# Patient Record
Sex: Female | Born: 1955
Health system: Southern US, Community
[De-identification: ages and names within clinical notes are randomized; demographics above are authoritative.]

## PROBLEM LIST (undated history)

## (undated) DIAGNOSIS — R011 Cardiac murmur, unspecified: Secondary | ICD-10-CM

## (undated) DIAGNOSIS — F10231 Alcohol dependence with withdrawal delirium: Secondary | ICD-10-CM

## (undated) DIAGNOSIS — H539 Unspecified visual disturbance: Secondary | ICD-10-CM

## (undated) DIAGNOSIS — I10 Essential (primary) hypertension: Secondary | ICD-10-CM

## (undated) DIAGNOSIS — D126 Benign neoplasm of colon, unspecified: Secondary | ICD-10-CM

## (undated) DIAGNOSIS — K7010 Alcoholic hepatitis without ascites: Secondary | ICD-10-CM

## (undated) DIAGNOSIS — E871 Hypo-osmolality and hyponatremia: Secondary | ICD-10-CM

## (undated) DIAGNOSIS — F32A Depression, unspecified: Secondary | ICD-10-CM

## (undated) DIAGNOSIS — M8580 Other specified disorders of bone density and structure, unspecified site: Secondary | ICD-10-CM

## (undated) DIAGNOSIS — E785 Hyperlipidemia, unspecified: Secondary | ICD-10-CM

## (undated) DIAGNOSIS — K219 Gastro-esophageal reflux disease without esophagitis: Secondary | ICD-10-CM

## (undated) DIAGNOSIS — F329 Major depressive disorder, single episode, unspecified: Secondary | ICD-10-CM

## (undated) HISTORY — DX: Unspecified visual disturbance: H53.9

## (undated) HISTORY — PX: BACK SURGERY: SHX140

## (undated) HISTORY — DX: Hyperlipidemia, unspecified: E78.5

## (undated) HISTORY — DX: Essential (primary) hypertension: I10

## (undated) HISTORY — DX: Alcohol dependence with withdrawal delirium: F10.231

## (undated) HISTORY — DX: Gastro-esophageal reflux disease without esophagitis: K21.9

## (undated) HISTORY — DX: Depression, unspecified: F32.A

## (undated) HISTORY — PX: COLONOSCOPY: SHX174

## (undated) HISTORY — DX: Major depressive disorder, single episode, unspecified: F32.9

## (undated) HISTORY — DX: Cardiac murmur, unspecified: R01.1

## (undated) HISTORY — DX: Other specified disorders of bone density and structure, unspecified site: M85.80

## (undated) HISTORY — DX: Benign neoplasm of colon, unspecified: D12.6

## (undated) HISTORY — DX: Alcoholic hepatitis without ascites: K70.10

---

## 2001-05-30 HISTORY — PX: BUNIONECTOMY: SHX129

## 2003-05-31 HISTORY — PX: MANDIBLE SURGERY: SHX707

## 2010-03-19 DIAGNOSIS — M51379 Other intervertebral disc degeneration, lumbosacral region without mention of lumbar back pain or lower extremity pain: Secondary | ICD-10-CM | POA: Insufficient documentation

## 2010-03-19 DIAGNOSIS — M5137 Other intervertebral disc degeneration, lumbosacral region: Secondary | ICD-10-CM | POA: Insufficient documentation

## 2010-06-01 DIAGNOSIS — F1010 Alcohol abuse, uncomplicated: Secondary | ICD-10-CM | POA: Insufficient documentation

## 2010-06-01 DIAGNOSIS — M961 Postlaminectomy syndrome, not elsewhere classified: Secondary | ICD-10-CM | POA: Insufficient documentation

## 2010-06-01 DIAGNOSIS — F1120 Opioid dependence, uncomplicated: Secondary | ICD-10-CM | POA: Insufficient documentation

## 2010-06-01 DIAGNOSIS — G894 Chronic pain syndrome: Secondary | ICD-10-CM | POA: Insufficient documentation

## 2013-05-10 DIAGNOSIS — K219 Gastro-esophageal reflux disease without esophagitis: Secondary | ICD-10-CM | POA: Insufficient documentation

## 2013-05-30 HISTORY — PX: TOTAL SHOULDER REPLACEMENT: SUR1217

## 2015-06-29 DIAGNOSIS — I10 Essential (primary) hypertension: Secondary | ICD-10-CM | POA: Insufficient documentation

## 2015-06-29 DIAGNOSIS — I951 Orthostatic hypotension: Secondary | ICD-10-CM | POA: Insufficient documentation

## 2015-06-29 DIAGNOSIS — Z8249 Family history of ischemic heart disease and other diseases of the circulatory system: Secondary | ICD-10-CM | POA: Insufficient documentation

## 2015-06-29 DIAGNOSIS — I340 Nonrheumatic mitral (valve) insufficiency: Secondary | ICD-10-CM | POA: Insufficient documentation

## 2016-06-01 DIAGNOSIS — M5440 Lumbago with sciatica, unspecified side: Secondary | ICD-10-CM | POA: Diagnosis not present

## 2016-06-17 DIAGNOSIS — Z96612 Presence of left artificial shoulder joint: Secondary | ICD-10-CM | POA: Diagnosis not present

## 2016-06-17 DIAGNOSIS — M7542 Impingement syndrome of left shoulder: Secondary | ICD-10-CM | POA: Diagnosis not present

## 2016-06-17 DIAGNOSIS — M25519 Pain in unspecified shoulder: Secondary | ICD-10-CM | POA: Diagnosis not present

## 2016-06-29 DIAGNOSIS — M5440 Lumbago with sciatica, unspecified side: Secondary | ICD-10-CM | POA: Diagnosis not present

## 2016-07-27 DIAGNOSIS — M5440 Lumbago with sciatica, unspecified side: Secondary | ICD-10-CM | POA: Diagnosis not present

## 2016-08-09 DIAGNOSIS — M4316 Spondylolisthesis, lumbar region: Secondary | ICD-10-CM | POA: Diagnosis not present

## 2016-08-09 DIAGNOSIS — Z981 Arthrodesis status: Secondary | ICD-10-CM | POA: Diagnosis not present

## 2016-08-09 DIAGNOSIS — M5136 Other intervertebral disc degeneration, lumbar region: Secondary | ICD-10-CM | POA: Diagnosis not present

## 2016-08-17 DIAGNOSIS — F410 Panic disorder [episodic paroxysmal anxiety] without agoraphobia: Secondary | ICD-10-CM | POA: Diagnosis not present

## 2016-08-17 DIAGNOSIS — I10 Essential (primary) hypertension: Secondary | ICD-10-CM | POA: Diagnosis not present

## 2016-08-17 DIAGNOSIS — F329 Major depressive disorder, single episode, unspecified: Secondary | ICD-10-CM | POA: Diagnosis not present

## 2016-08-24 DIAGNOSIS — M542 Cervicalgia: Secondary | ICD-10-CM | POA: Diagnosis not present

## 2016-08-24 DIAGNOSIS — M5440 Lumbago with sciatica, unspecified side: Secondary | ICD-10-CM | POA: Diagnosis not present

## 2016-09-07 DIAGNOSIS — M5136 Other intervertebral disc degeneration, lumbar region: Secondary | ICD-10-CM | POA: Diagnosis not present

## 2016-09-07 DIAGNOSIS — M4316 Spondylolisthesis, lumbar region: Secondary | ICD-10-CM | POA: Diagnosis not present

## 2016-09-12 DIAGNOSIS — M4316 Spondylolisthesis, lumbar region: Secondary | ICD-10-CM | POA: Diagnosis not present

## 2016-09-12 DIAGNOSIS — M5136 Other intervertebral disc degeneration, lumbar region: Secondary | ICD-10-CM | POA: Diagnosis not present

## 2016-09-15 DIAGNOSIS — M4316 Spondylolisthesis, lumbar region: Secondary | ICD-10-CM | POA: Diagnosis not present

## 2016-09-15 DIAGNOSIS — M5136 Other intervertebral disc degeneration, lumbar region: Secondary | ICD-10-CM | POA: Diagnosis not present

## 2016-09-21 DIAGNOSIS — M5440 Lumbago with sciatica, unspecified side: Secondary | ICD-10-CM | POA: Diagnosis not present

## 2016-09-21 DIAGNOSIS — M545 Low back pain: Secondary | ICD-10-CM | POA: Diagnosis not present

## 2016-09-23 DIAGNOSIS — M4316 Spondylolisthesis, lumbar region: Secondary | ICD-10-CM | POA: Diagnosis not present

## 2016-09-23 DIAGNOSIS — M5136 Other intervertebral disc degeneration, lumbar region: Secondary | ICD-10-CM | POA: Diagnosis not present

## 2016-09-28 DIAGNOSIS — M5136 Other intervertebral disc degeneration, lumbar region: Secondary | ICD-10-CM | POA: Diagnosis not present

## 2016-09-28 DIAGNOSIS — M4316 Spondylolisthesis, lumbar region: Secondary | ICD-10-CM | POA: Diagnosis not present

## 2016-09-30 DIAGNOSIS — M4316 Spondylolisthesis, lumbar region: Secondary | ICD-10-CM | POA: Diagnosis not present

## 2016-09-30 DIAGNOSIS — M5136 Other intervertebral disc degeneration, lumbar region: Secondary | ICD-10-CM | POA: Diagnosis not present

## 2016-10-03 DIAGNOSIS — I10 Essential (primary) hypertension: Secondary | ICD-10-CM | POA: Diagnosis not present

## 2016-10-03 DIAGNOSIS — Z6826 Body mass index (BMI) 26.0-26.9, adult: Secondary | ICD-10-CM | POA: Diagnosis not present

## 2016-10-03 DIAGNOSIS — E785 Hyperlipidemia, unspecified: Secondary | ICD-10-CM | POA: Diagnosis not present

## 2016-10-03 DIAGNOSIS — M5416 Radiculopathy, lumbar region: Secondary | ICD-10-CM | POA: Diagnosis not present

## 2016-10-03 DIAGNOSIS — M47816 Spondylosis without myelopathy or radiculopathy, lumbar region: Secondary | ICD-10-CM | POA: Diagnosis not present

## 2016-10-04 DIAGNOSIS — M4316 Spondylolisthesis, lumbar region: Secondary | ICD-10-CM | POA: Diagnosis not present

## 2016-10-04 DIAGNOSIS — M5136 Other intervertebral disc degeneration, lumbar region: Secondary | ICD-10-CM | POA: Diagnosis not present

## 2016-10-06 DIAGNOSIS — M5136 Other intervertebral disc degeneration, lumbar region: Secondary | ICD-10-CM | POA: Diagnosis not present

## 2016-10-06 DIAGNOSIS — M4316 Spondylolisthesis, lumbar region: Secondary | ICD-10-CM | POA: Diagnosis not present

## 2016-10-17 DIAGNOSIS — M4316 Spondylolisthesis, lumbar region: Secondary | ICD-10-CM | POA: Diagnosis not present

## 2016-10-17 DIAGNOSIS — M5136 Other intervertebral disc degeneration, lumbar region: Secondary | ICD-10-CM | POA: Diagnosis not present

## 2016-10-18 DIAGNOSIS — M5136 Other intervertebral disc degeneration, lumbar region: Secondary | ICD-10-CM | POA: Diagnosis not present

## 2016-10-18 DIAGNOSIS — M4316 Spondylolisthesis, lumbar region: Secondary | ICD-10-CM | POA: Diagnosis not present

## 2016-10-19 DIAGNOSIS — M961 Postlaminectomy syndrome, not elsewhere classified: Secondary | ICD-10-CM | POA: Diagnosis not present

## 2016-10-19 DIAGNOSIS — M5440 Lumbago with sciatica, unspecified side: Secondary | ICD-10-CM | POA: Diagnosis not present

## 2016-10-19 DIAGNOSIS — G894 Chronic pain syndrome: Secondary | ICD-10-CM | POA: Diagnosis not present

## 2016-11-16 DIAGNOSIS — M5440 Lumbago with sciatica, unspecified side: Secondary | ICD-10-CM | POA: Diagnosis not present

## 2016-12-14 DIAGNOSIS — M5440 Lumbago with sciatica, unspecified side: Secondary | ICD-10-CM | POA: Diagnosis not present

## 2016-12-14 DIAGNOSIS — M961 Postlaminectomy syndrome, not elsewhere classified: Secondary | ICD-10-CM | POA: Diagnosis not present

## 2016-12-14 DIAGNOSIS — G894 Chronic pain syndrome: Secondary | ICD-10-CM | POA: Diagnosis not present

## 2016-12-26 DIAGNOSIS — R740 Nonspecific elevation of levels of transaminase and lactic acid dehydrogenase [LDH]: Secondary | ICD-10-CM | POA: Diagnosis not present

## 2016-12-26 DIAGNOSIS — F410 Panic disorder [episodic paroxysmal anxiety] without agoraphobia: Secondary | ICD-10-CM | POA: Diagnosis not present

## 2016-12-26 DIAGNOSIS — T402X5A Adverse effect of other opioids, initial encounter: Secondary | ICD-10-CM | POA: Diagnosis not present

## 2016-12-26 DIAGNOSIS — K5903 Drug induced constipation: Secondary | ICD-10-CM | POA: Diagnosis not present

## 2016-12-26 DIAGNOSIS — I10 Essential (primary) hypertension: Secondary | ICD-10-CM | POA: Diagnosis not present

## 2016-12-26 DIAGNOSIS — K219 Gastro-esophageal reflux disease without esophagitis: Secondary | ICD-10-CM | POA: Diagnosis not present

## 2016-12-26 DIAGNOSIS — E538 Deficiency of other specified B group vitamins: Secondary | ICD-10-CM | POA: Diagnosis not present

## 2016-12-29 DIAGNOSIS — S60456A Superficial foreign body of right little finger, initial encounter: Secondary | ICD-10-CM | POA: Diagnosis not present

## 2017-01-02 DIAGNOSIS — R740 Nonspecific elevation of levels of transaminase and lactic acid dehydrogenase [LDH]: Secondary | ICD-10-CM | POA: Diagnosis not present

## 2017-01-02 DIAGNOSIS — K838 Other specified diseases of biliary tract: Secondary | ICD-10-CM | POA: Diagnosis not present

## 2017-02-08 DIAGNOSIS — M5440 Lumbago with sciatica, unspecified side: Secondary | ICD-10-CM | POA: Diagnosis not present

## 2017-03-08 DIAGNOSIS — M961 Postlaminectomy syndrome, not elsewhere classified: Secondary | ICD-10-CM | POA: Diagnosis not present

## 2017-03-08 DIAGNOSIS — M5440 Lumbago with sciatica, unspecified side: Secondary | ICD-10-CM | POA: Diagnosis not present

## 2017-03-08 DIAGNOSIS — G5623 Lesion of ulnar nerve, bilateral upper limbs: Secondary | ICD-10-CM | POA: Diagnosis not present

## 2017-03-24 DIAGNOSIS — Z23 Encounter for immunization: Secondary | ICD-10-CM | POA: Diagnosis not present

## 2017-04-05 DIAGNOSIS — M961 Postlaminectomy syndrome, not elsewhere classified: Secondary | ICD-10-CM | POA: Diagnosis not present

## 2017-04-05 DIAGNOSIS — G5623 Lesion of ulnar nerve, bilateral upper limbs: Secondary | ICD-10-CM | POA: Diagnosis not present

## 2017-04-05 DIAGNOSIS — G894 Chronic pain syndrome: Secondary | ICD-10-CM | POA: Diagnosis not present

## 2017-05-03 DIAGNOSIS — M5440 Lumbago with sciatica, unspecified side: Secondary | ICD-10-CM | POA: Diagnosis not present

## 2017-05-19 DIAGNOSIS — M5440 Lumbago with sciatica, unspecified side: Secondary | ICD-10-CM | POA: Diagnosis not present

## 2017-05-19 DIAGNOSIS — M5126 Other intervertebral disc displacement, lumbar region: Secondary | ICD-10-CM | POA: Diagnosis not present

## 2017-05-31 DIAGNOSIS — M5440 Lumbago with sciatica, unspecified side: Secondary | ICD-10-CM | POA: Diagnosis not present

## 2017-05-31 DIAGNOSIS — M542 Cervicalgia: Secondary | ICD-10-CM | POA: Diagnosis not present

## 2017-06-28 DIAGNOSIS — M5440 Lumbago with sciatica, unspecified side: Secondary | ICD-10-CM | POA: Diagnosis not present

## 2017-07-04 DIAGNOSIS — M48062 Spinal stenosis, lumbar region with neurogenic claudication: Secondary | ICD-10-CM | POA: Diagnosis not present

## 2017-07-04 DIAGNOSIS — Z01818 Encounter for other preprocedural examination: Secondary | ICD-10-CM | POA: Diagnosis not present

## 2017-07-04 DIAGNOSIS — R9431 Abnormal electrocardiogram [ECG] [EKG]: Secondary | ICD-10-CM | POA: Diagnosis not present

## 2017-07-04 DIAGNOSIS — Z01810 Encounter for preprocedural cardiovascular examination: Secondary | ICD-10-CM | POA: Diagnosis not present

## 2017-07-04 DIAGNOSIS — M532X6 Spinal instabilities, lumbar region: Secondary | ICD-10-CM | POA: Diagnosis not present

## 2017-07-04 DIAGNOSIS — I951 Orthostatic hypotension: Secondary | ICD-10-CM | POA: Diagnosis not present

## 2017-07-04 DIAGNOSIS — M7989 Other specified soft tissue disorders: Secondary | ICD-10-CM | POA: Diagnosis not present

## 2017-07-04 DIAGNOSIS — Z981 Arthrodesis status: Secondary | ICD-10-CM | POA: Diagnosis not present

## 2017-07-04 DIAGNOSIS — I10 Essential (primary) hypertension: Secondary | ICD-10-CM | POA: Diagnosis not present

## 2017-07-14 DIAGNOSIS — Z01810 Encounter for preprocedural cardiovascular examination: Secondary | ICD-10-CM | POA: Diagnosis not present

## 2017-07-21 DIAGNOSIS — G8929 Other chronic pain: Secondary | ICD-10-CM | POA: Diagnosis not present

## 2017-07-21 DIAGNOSIS — M5442 Lumbago with sciatica, left side: Secondary | ICD-10-CM | POA: Diagnosis not present

## 2017-07-21 DIAGNOSIS — M5441 Lumbago with sciatica, right side: Secondary | ICD-10-CM | POA: Diagnosis not present

## 2017-08-23 DIAGNOSIS — G8929 Other chronic pain: Secondary | ICD-10-CM | POA: Diagnosis not present

## 2017-08-23 DIAGNOSIS — M5441 Lumbago with sciatica, right side: Secondary | ICD-10-CM | POA: Diagnosis not present

## 2017-08-23 DIAGNOSIS — M5442 Lumbago with sciatica, left side: Secondary | ICD-10-CM | POA: Diagnosis not present

## 2017-09-13 DIAGNOSIS — Z87891 Personal history of nicotine dependence: Secondary | ICD-10-CM | POA: Diagnosis not present

## 2017-09-13 DIAGNOSIS — E876 Hypokalemia: Secondary | ICD-10-CM | POA: Diagnosis not present

## 2017-09-13 DIAGNOSIS — E871 Hypo-osmolality and hyponatremia: Secondary | ICD-10-CM | POA: Diagnosis not present

## 2017-09-14 DIAGNOSIS — K838 Other specified diseases of biliary tract: Secondary | ICD-10-CM | POA: Diagnosis not present

## 2017-09-14 DIAGNOSIS — E878 Other disorders of electrolyte and fluid balance, not elsewhere classified: Secondary | ICD-10-CM | POA: Diagnosis not present

## 2017-09-15 DIAGNOSIS — T8182XA Emphysema (subcutaneous) resulting from a procedure, initial encounter: Secondary | ICD-10-CM | POA: Diagnosis not present

## 2017-09-15 DIAGNOSIS — I10 Essential (primary) hypertension: Secondary | ICD-10-CM | POA: Diagnosis not present

## 2017-09-15 DIAGNOSIS — M48062 Spinal stenosis, lumbar region with neurogenic claudication: Secondary | ICD-10-CM | POA: Diagnosis not present

## 2017-09-15 DIAGNOSIS — E785 Hyperlipidemia, unspecified: Secondary | ICD-10-CM | POA: Diagnosis not present

## 2017-09-15 DIAGNOSIS — E871 Hypo-osmolality and hyponatremia: Secondary | ICD-10-CM | POA: Diagnosis not present

## 2017-09-15 DIAGNOSIS — M48061 Spinal stenosis, lumbar region without neurogenic claudication: Secondary | ICD-10-CM | POA: Diagnosis not present

## 2017-09-15 DIAGNOSIS — E876 Hypokalemia: Secondary | ICD-10-CM | POA: Diagnosis not present

## 2017-09-15 DIAGNOSIS — E878 Other disorders of electrolyte and fluid balance, not elsewhere classified: Secondary | ICD-10-CM | POA: Diagnosis not present

## 2017-09-15 DIAGNOSIS — M5136 Other intervertebral disc degeneration, lumbar region: Secondary | ICD-10-CM | POA: Diagnosis not present

## 2017-09-15 HISTORY — PX: POSTERIOR LUMBAR VERTEBRAE EXCISION: SUR545

## 2017-09-16 DIAGNOSIS — E876 Hypokalemia: Secondary | ICD-10-CM | POA: Diagnosis not present

## 2017-09-16 DIAGNOSIS — I10 Essential (primary) hypertension: Secondary | ICD-10-CM | POA: Diagnosis not present

## 2017-09-16 DIAGNOSIS — Z9889 Other specified postprocedural states: Secondary | ICD-10-CM | POA: Diagnosis not present

## 2017-09-16 DIAGNOSIS — E785 Hyperlipidemia, unspecified: Secondary | ICD-10-CM | POA: Diagnosis not present

## 2017-09-16 DIAGNOSIS — E871 Hypo-osmolality and hyponatremia: Secondary | ICD-10-CM | POA: Diagnosis not present

## 2017-09-18 DIAGNOSIS — R918 Other nonspecific abnormal finding of lung field: Secondary | ICD-10-CM | POA: Diagnosis not present

## 2017-09-18 DIAGNOSIS — Z4682 Encounter for fitting and adjustment of non-vascular catheter: Secondary | ICD-10-CM | POA: Diagnosis not present

## 2017-09-18 DIAGNOSIS — E785 Hyperlipidemia, unspecified: Secondary | ICD-10-CM | POA: Diagnosis not present

## 2017-09-18 DIAGNOSIS — E878 Other disorders of electrolyte and fluid balance, not elsewhere classified: Secondary | ICD-10-CM | POA: Diagnosis not present

## 2017-09-18 DIAGNOSIS — I10 Essential (primary) hypertension: Secondary | ICD-10-CM | POA: Diagnosis not present

## 2017-09-18 DIAGNOSIS — E871 Hypo-osmolality and hyponatremia: Secondary | ICD-10-CM | POA: Diagnosis not present

## 2017-09-18 DIAGNOSIS — E876 Hypokalemia: Secondary | ICD-10-CM | POA: Diagnosis not present

## 2017-09-19 DIAGNOSIS — E876 Hypokalemia: Secondary | ICD-10-CM | POA: Diagnosis not present

## 2017-09-19 DIAGNOSIS — I10 Essential (primary) hypertension: Secondary | ICD-10-CM | POA: Diagnosis not present

## 2017-09-19 DIAGNOSIS — E785 Hyperlipidemia, unspecified: Secondary | ICD-10-CM | POA: Diagnosis not present

## 2017-09-19 DIAGNOSIS — E871 Hypo-osmolality and hyponatremia: Secondary | ICD-10-CM | POA: Diagnosis not present

## 2017-09-19 DIAGNOSIS — E878 Other disorders of electrolyte and fluid balance, not elsewhere classified: Secondary | ICD-10-CM | POA: Diagnosis not present

## 2017-09-25 DIAGNOSIS — E871 Hypo-osmolality and hyponatremia: Secondary | ICD-10-CM | POA: Diagnosis not present

## 2017-09-26 DIAGNOSIS — E871 Hypo-osmolality and hyponatremia: Secondary | ICD-10-CM | POA: Diagnosis not present

## 2017-09-28 DIAGNOSIS — Z981 Arthrodesis status: Secondary | ICD-10-CM | POA: Diagnosis not present

## 2017-09-28 DIAGNOSIS — Z9889 Other specified postprocedural states: Secondary | ICD-10-CM | POA: Diagnosis not present

## 2017-09-29 DIAGNOSIS — Z981 Arthrodesis status: Secondary | ICD-10-CM | POA: Diagnosis not present

## 2017-09-29 DIAGNOSIS — T8182XA Emphysema (subcutaneous) resulting from a procedure, initial encounter: Secondary | ICD-10-CM | POA: Diagnosis not present

## 2017-09-29 DIAGNOSIS — R222 Localized swelling, mass and lump, trunk: Secondary | ICD-10-CM | POA: Diagnosis not present

## 2017-09-29 DIAGNOSIS — E871 Hypo-osmolality and hyponatremia: Secondary | ICD-10-CM | POA: Diagnosis not present

## 2017-10-01 DIAGNOSIS — E871 Hypo-osmolality and hyponatremia: Secondary | ICD-10-CM | POA: Diagnosis not present

## 2017-10-04 DIAGNOSIS — Z981 Arthrodesis status: Secondary | ICD-10-CM | POA: Diagnosis not present

## 2017-10-12 ENCOUNTER — Telehealth: Payer: Self-pay | Admitting: General Practice

## 2017-10-12 NOTE — Telephone Encounter (Signed)
Pt came in asking to establish herself and husband with Dr. Birdie Riddle. Mentioned speaking with someone previously about it but could not recall when or who she spoke with. Also submitted a copy of script for Physical Therapy, written at [facility] on 10/05/17. PT states that she is in pain post back-surgery and wants to establish quickly. Copy of PT script placed in front bin. Requested a call back to confirm, leave a message if not answered.

## 2017-10-12 NOTE — Telephone Encounter (Signed)
Please advise 

## 2017-10-12 NOTE — Telephone Encounter (Signed)
Pt scheduled to establish with Dr. Jonni Sanger.

## 2017-10-12 NOTE — Telephone Encounter (Signed)
Not able to accept pt at this time as I do not have any upcoming availability and her condition clearly requires care sooner than I can provide.  She will need to establish with someone else.

## 2017-10-17 ENCOUNTER — Ambulatory Visit (INDEPENDENT_AMBULATORY_CARE_PROVIDER_SITE_OTHER): Payer: Medicare Other | Admitting: Family Medicine

## 2017-10-17 ENCOUNTER — Encounter: Payer: Self-pay | Admitting: Family Medicine

## 2017-10-17 ENCOUNTER — Other Ambulatory Visit: Payer: Self-pay

## 2017-10-17 ENCOUNTER — Other Ambulatory Visit: Payer: Self-pay | Admitting: Emergency Medicine

## 2017-10-17 VITALS — BP 144/82 | HR 80 | Temp 98.2°F | Ht 65.25 in | Wt 150.8 lb

## 2017-10-17 DIAGNOSIS — M48061 Spinal stenosis, lumbar region without neurogenic claudication: Secondary | ICD-10-CM | POA: Diagnosis not present

## 2017-10-17 DIAGNOSIS — F1120 Opioid dependence, uncomplicated: Secondary | ICD-10-CM | POA: Diagnosis not present

## 2017-10-17 DIAGNOSIS — I10 Essential (primary) hypertension: Secondary | ICD-10-CM

## 2017-10-17 DIAGNOSIS — M961 Postlaminectomy syndrome, not elsewhere classified: Secondary | ICD-10-CM | POA: Diagnosis not present

## 2017-10-17 DIAGNOSIS — F10931 Alcohol use, unspecified with withdrawal delirium: Secondary | ICD-10-CM

## 2017-10-17 DIAGNOSIS — K7010 Alcoholic hepatitis without ascites: Secondary | ICD-10-CM | POA: Diagnosis not present

## 2017-10-17 DIAGNOSIS — F10231 Alcohol dependence with withdrawal delirium: Secondary | ICD-10-CM

## 2017-10-17 DIAGNOSIS — F339 Major depressive disorder, recurrent, unspecified: Secondary | ICD-10-CM | POA: Insufficient documentation

## 2017-10-17 DIAGNOSIS — G8929 Other chronic pain: Secondary | ICD-10-CM

## 2017-10-17 DIAGNOSIS — G4701 Insomnia due to medical condition: Secondary | ICD-10-CM | POA: Diagnosis not present

## 2017-10-17 DIAGNOSIS — Z9889 Other specified postprocedural states: Secondary | ICD-10-CM

## 2017-10-17 DIAGNOSIS — G894 Chronic pain syndrome: Secondary | ICD-10-CM | POA: Diagnosis not present

## 2017-10-17 DIAGNOSIS — F1021 Alcohol dependence, in remission: Secondary | ICD-10-CM

## 2017-10-17 HISTORY — DX: Alcohol use, unspecified with withdrawal delirium: F10.931

## 2017-10-17 HISTORY — DX: Alcoholic hepatitis without ascites: K70.10

## 2017-10-17 HISTORY — DX: Alcohol dependence with withdrawal delirium: F10.231

## 2017-10-17 LAB — COMPREHENSIVE METABOLIC PANEL
ALT: 12 U/L (ref 0–35)
AST: 15 U/L (ref 0–37)
Albumin: 4.1 g/dL (ref 3.5–5.2)
Alkaline Phosphatase: 99 U/L (ref 39–117)
BUN: 9 mg/dL (ref 6–23)
CO2: 28 mEq/L (ref 19–32)
Calcium: 9 mg/dL (ref 8.4–10.5)
Chloride: 98 mEq/L (ref 96–112)
Creatinine, Ser: 0.62 mg/dL (ref 0.40–1.20)
GFR: 104.32 mL/min (ref >60.00)
Glucose, Bld: 105 mg/dL — ABNORMAL HIGH (ref 70–99)
Potassium: 3.7 mEq/L (ref 3.5–5.1)
Sodium: 134 mEq/L — ABNORMAL LOW (ref 135–145)
Total Bilirubin: 0.4 mg/dL (ref 0.2–1.2)
Total Protein: 6.3 g/dL (ref 6.0–8.3)

## 2017-10-17 LAB — CBC WITH DIFFERENTIAL/PLATELET
Basophils Absolute: 0 10*3/uL (ref 0.0–0.1)
Basophils Relative: 0.6 % (ref 0.0–3.0)
Eosinophils Absolute: 0.2 10*3/uL (ref 0.0–0.7)
Eosinophils Relative: 3.8 % (ref 0.0–5.0)
HCT: 31.1 % — ABNORMAL LOW (ref 36.0–46.0)
Hemoglobin: 10.9 g/dL — ABNORMAL LOW (ref 12.0–15.0)
Lymphocytes Relative: 24.4 % (ref 12.0–46.0)
Lymphs Abs: 1.4 10*3/uL (ref 0.7–4.0)
MCHC: 34.9 g/dL (ref 30.0–36.0)
MCV: 94.7 fl (ref 78.0–100.0)
Monocytes Absolute: 0.3 10*3/uL (ref 0.1–1.0)
Monocytes Relative: 5.8 % (ref 3.0–12.0)
Neutro Abs: 3.8 10*3/uL (ref 1.4–7.7)
Neutrophils Relative %: 65.4 % (ref 43.0–77.0)
Platelets: 476 10*3/uL — ABNORMAL HIGH (ref 150.0–400.0)
RBC: 3.29 Mil/uL — ABNORMAL LOW (ref 3.87–5.11)
RDW: 12.9 % (ref 11.5–15.5)
WBC: 5.8 10*3/uL (ref 4.0–10.5)

## 2017-10-17 LAB — LIPID PANEL
Cholesterol: 119 mg/dL (ref 0–200)
HDL: 56.6 mg/dL (ref >39.00)
LDL Cholesterol: 49 mg/dL (ref 0–99)
NonHDL: 62.73
Total CHOL/HDL Ratio: 2
Triglycerides: 68 mg/dL (ref 0.0–149.0)
VLDL: 13.6 mg/dL (ref 0.0–40.0)

## 2017-10-17 MED ORDER — LOSARTAN POTASSIUM 100 MG PO TABS: 100.0000 mg | ORAL_TABLET | Freq: Every day | ORAL | 1 refills | Status: DC

## 2017-10-17 MED ORDER — TRAZODONE HCL 50 MG PO TABS: 50.0000 mg | ORAL_TABLET | Freq: Every day | ORAL | 1 refills | Status: DC

## 2017-10-17 NOTE — Patient Instructions (Addendum)
Follow up in 6 weeks to recheck Blood Pressure & Sleep  Please go to the Lab for blood work.    If you have MyChart, your results will be available to view, please respond through [place] with questions.  We will schedule follow-up according to results.   We will call you with information regarding your referral appointment for Physical Therapy

## 2017-10-17 NOTE — Progress Notes (Signed)
Subjective  CC:  Chief Complaint  Patient presents with  . Establish Care    Transfer from [place], [place], needs to get Physical Therapy referral since having back Surgery.   . Back Pain    on going back Pain   . Hypertension  . Insomnia    having trouble falling and staying asleep     HPI: Shannon Young is a 62 y.o. female who presents to [place] at [facility] today to establish care with me as a new patient.  Vitals:   10/17/17 1303  BP: (!) 144/82  Pulse: 80  Temp: 98.2 F (36.8 C)   I have reviewed extensive notes from careeverywhere dating back to 2014. I've personally reviewed recent office visit notes, hospital notes, associated labs and imaging reports and/or pertinent outside office records via chart review or CareEverywhere. Briefly, 62 yo recently d/cd from [facility] hospital s/p lumbar spine surgery and DTs from alcohol and benzos. She has suffered from chronic pain and has been managed by [facility] [place]; she formerly lived in [place], relocating here to [place] last fall.   She has the following concerns or needs:  Since surgery, 4/19, she has not had anymore alcohol. She endorses long history of alcoholism; reports she has not had any problems quitting cold [place]. However, she is now having trouble sleeping. She has been weaned of benzo's. Has never been on sleep meds. Reports chronic back pain keep her awake.   Chronic pain and opiod dependence: managed by [facility] and pain clinic in [place]. I have reviewed narcotic use database.   HTN: BP was low upon d/c from hospital and chlorthalidone and amlodipine were stopped and losartan was halved. Since, bp running 140s/80s-90s. Has had preop clearance by cards recently with echocardiogram. No CAD  Reports recent h/o hyperlipidemia on lipitor but ran out. + FH of premature CAD.  Denies mood problems.   Assessment  1. Essential hypertension   2. Postlaminectomy syndrome, not  elsewhere classified   3. Uncomplicated opioid dependence ([place])   4. Chronic pain syndrome   5. Alcohol dependence in remission ([place])   6. Insomnia secondary to chronic pain   7. Status post lumbar surgery   8. Spinal stenosis of lumbar region, unspecified whether neurogenic claudication present      Plan   HTN: increase losartan back to 100 and monitor. Recheck in 6-8 weeks.   Chronic back pain: per [facility] pain mgt center. Referred to PT due to rehab needs after surgery.   Alcoholism in remission: praised. Check etoh level. Follow alcohol hepatitis with labs.  Insomnia: trial of trazadone. Educated on expectations. Avoid benzo's. Discussed dysregulated sleep due to recent heavy alcohol intake.   HM: defer for now; once above improved and pt is recovered, will need CPE. Pt agrees.   Follow up:  Return in about 6 weeks (around 11/28/2017) for follow up Hypertension, sleep. Orders Placed This Encounter  Procedures  . Comprehensive metabolic panel  . CBC with Differential/Platelet  . Lipid panel  . Ethanol  . Ambulatory referral to Orthopedic Surgery   Meds ordered this encounter  Medications  . traZODone (DESYREL) 50 MG tablet    Sig: Take 1 tablet (50 mg total) by mouth at bedtime.    Dispense:  90 tablet    Refill:  1  . losartan (COZAAR) 100 MG tablet    Sig: Take 1 tablet (100 mg total) by mouth daily.  Dispense:  90 tablet    Refill:  1      We updated and reviewed the patient's past history in detail and it is documented below.  Patient Active Problem List   Diagnosis Date Noted  . Depression 10/17/2017  . Alcohol dependence in remission ([place]) 10/17/2017  . Insomnia secondary to chronic pain 10/17/2017  . Spinal stenosis of lumbar region 10/17/2017  . Essential hypertension 06/29/2015  . Family history of ischemic heart disease (IHD) 06/29/2015  . Nonrheumatic mitral valve regurgitation 06/29/2015  . Orthostatic hypotension 06/29/2015  . Neck pain 10/02/2013      See lumbar plan of care   . GERD (gastroesophageal reflux disease) 05/10/2013  . Back pain 05/10/2013    Lumbar pain  Last Assessment & Plan:  [facility] PM&R Assessment and Plan Anomalous Vertebra?  No   Allergies that may influence a procedure:  no   Medications that may influence a procedure:  No   PMSH that may influence a procedure:  no   Diagnostic Tests:  EMG/NCS: Done previously, not in our system  MRI: Done previously, not in our system  X-ray: Done previously, not in our system   Prior treatments:  Physical Therapy: Yes, [place], Multiple bouts, 3 months plus HEP Medications tried: Opioids, NSAIDs, Muscle Relaxers, oxycontin (failure), opana (failure), MS contin (failure),  Outside Records: All outside records that may have been scanned into EPIC have been reviewed and discussed with the patient.  Procedures: SCS placement and removal, Disc Fusion surgery, Epidural steroid injections,   This patient seen in consultation referred by Dr. Iva Lento (GI) for evaluation and treatment of low back pain.   RECOMMENDATIONS:   - oxycodone 30mg  tabs 1-2 tabs q 2-3 hours #180 tabs, refill 12/25/14 - Gabapentin 800 mg po qid refill 12/25/14 - Duragesic to 100 mcg refill 12/25/14 - MRI L-spine for worsening pain 11/27/14 - Physical therapist [place] in [place] 11/26/24 - SCS rep to contact patient  - Injection therapies not likely to give significant functional improvement. Risk-benefit profile not in favor for interventional care at this time.   - High risk of cervical interventions discussed (spinal cord injury, paralysis, seizure, stroke, death)   If fails to progress,  - spine surgery consultation for open surgical procedure  - pain medicine consultation if non-operative care chosen   . Chronic pain syndrome 06/01/2010  . Opioid dependence ([place]) 06/01/2010  . Postlaminectomy syndrome, not elsewhere classified 06/01/2010  . DDD (degenerative disc disease),  lumbosacral 03/19/2010   Health Maintenance  Topic Date Due  . Hepatitis C Screening  07/[birth year]/1957  . HIV Screening  09/03/1972  . MAMMOGRAM  09/04/1975  . TETANUS/TDAP  09/03/1976  . PAP SMEAR  09/04/1978  . COLONOSCOPY  09/04/2007  . INFLUENZA VACCINE  12/28/2017   Immunization History  Administered Date(s) Administered  . Influenza Split 02/27/2010   Current Meds  Medication Sig  . atorvastatin (LIPITOR) 20 MG tablet Take by mouth.  . Cyanocobalamin (B-12) 1000 MCG CAPS Take by mouth.  . DULoxetine (CYMBALTA) 60 MG capsule Take by mouth.  . fentaNYL (DURAGESIC - DOSED MCG/HR) 100 MCG/HR Place onto the skin.  . fluticasone (FLONASE) 50 MCG/ACT nasal spray Administer 1 spray into both nostrils daily.  . folic acid (FOLVITE) 1 MG tablet Take by mouth.  . losartan (COZAAR) 100 MG tablet Take 1 tablet (100 mg total) by mouth daily.  . Multiple Vitamin (MULTIVITAMIN) capsule Take by mouth.  . naloxone (NARCAN) nasal spray 4 mg/0.1 mL  Spray into one nostril for suspected overdose. Repeat x1, if no response after 3 minutes.  Dispense one package with 2 nasal sprays.  Marland Kitchen oxycodone (ROXICODONE) 30 MG immediate release tablet Take 30 mg by mouth 3 (three) times daily.   . pantoprazole (PROTONIX) 40 MG tablet TAKE ONE TABLET BY MOUTH TWICE DAILY  . thiamine 100 MG tablet Take by mouth.  . traZODone (DESYREL) 50 MG tablet Take 1 tablet (50 mg total) by mouth at bedtime.  . [DISCONTINUED] amLODipine (NORVASC) 5 MG tablet   . [DISCONTINUED] diphenhydrAMINE (BENADRYL) 2 % cream Apply topically.  . [DISCONTINUED] losartan (COZAAR) 50 MG tablet Take by mouth.    Allergies: Patient is allergic to gabapentin. Past Medical History Patient  has a past medical history of Depression, GERD (gastroesophageal reflux disease), Hyperlipidemia, and Hypertension. Past Surgical History Patient  has a past surgical history that includes Back surgery; Mandible fracture surgery; Total shoulder replacement; and  Bunionectomy. Family History: Patient family history includes Alcohol abuse in her brother; Colon cancer in her sister; Healthy in her daughter, daughter, daughter, and son; Hyperlipidemia in her father; Hypertension in her brother, father, and mother. Social History:  Patient  reports that she has quit smoking. She has never used smokeless tobacco. She reports that she drank alcohol. She reports that she has current or past drug history. Drugs: Fentanyl and Oxycodone.  Review of Systems: Constitutional: negative for fever or malaise Ophthalmic: negative for photophobia, double vision or loss of vision Cardiovascular: negative for chest pain, dyspnea on exertion, or new LE swelling Respiratory: negative for SOB or persistent cough Gastrointestinal: negative for abdominal pain, change in bowel habits or melena Genitourinary: negative for dysuria or gross hematuria Musculoskeletal: negative for new gait disturbance or muscular weakness Integumentary: negative for new or persistent rashes Neurological: negative for TIA or stroke symptoms Psychiatric: negative for SI or delusions Allergic/Immunologic: negative for hives  Patient Care Team    Relationship Specialty Notifications Start End  Leamon Arnt, MD PCP - General Family Medicine  10/17/17     Objective  Vitals: BP (!) 144/82   Pulse 80   Temp 98.2 F (36.8 C)   Ht 5' 5.25" (1.657 m)   Wt 150 lb 12.8 oz (68.4 kg)   BMI 24.90 kg/m  General:  Well developed, well nourished, appears uncomfortable Psych:  Alert and oriented,normal mood and affect HEENT:  Normocephalic, atraumatic, non-icteric sclera, PERRL,  Cardiovascular:  RRR without gallop, rub, soft systolic murmur present Respiratory:  Good breath sounds bilaterally, CTAB with normal respiratory effort Gastrointestinal: soft, small swelling laterally anterior to incision Skin:  Warm, dry Neurologic:    Mental status is normal.    Commons side effects, risks, benefits,  and alternatives for medications and treatment plan prescribed today were discussed, and the patient expressed understanding of the given instructions. Patient is instructed to call or message via MyChart if he/she has any questions or concerns regarding our treatment plan. No barriers to understanding were identified. We discussed Red Flag symptoms and signs in detail. Patient expressed understanding regarding what to do in case of urgent or emergency type symptoms.   Medication list was reconciled, printed and provided to the patient in AVS. Patient instructions and summary information was reviewed with the patient as documented in the AVS. This note was prepared with assistance of Dragon voice recognition software. Occasional wrong-word or sound-a-like substitutions may have occurred due to the inherent limitations of voice recognition software

## 2017-10-18 LAB — ETHANOL: Ethanol: NEGATIVE %

## 2017-10-19 NOTE — Progress Notes (Signed)
Please call patient: I have reviewed his/her lab results. Everything looks stable! Liver tests are now normal. Sodium is improved. Mild anemia remains and should improve with vitamin supplements and time to recover from recent surgery. We will recheck int next time.

## 2017-10-20 DIAGNOSIS — G8929 Other chronic pain: Secondary | ICD-10-CM | POA: Diagnosis not present

## 2017-10-20 DIAGNOSIS — M5441 Lumbago with sciatica, right side: Secondary | ICD-10-CM | POA: Diagnosis not present

## 2017-10-20 DIAGNOSIS — M5442 Lumbago with sciatica, left side: Secondary | ICD-10-CM | POA: Diagnosis not present

## 2017-10-25 ENCOUNTER — Telehealth: Payer: Self-pay | Admitting: Family Medicine

## 2017-10-25 NOTE — Telephone Encounter (Signed)
Copied from [place] [id number]. Topic: Quick Communication - See Telephone Encounter >> Oct 25, 2017 10:58 AM Hewitt Shorts wrote: Pt is needing a refill on her atorvastatin   [facility] [address] [phone]  Best number

## 2017-10-25 NOTE — Telephone Encounter (Signed)
Rx refill request of historical medication: Lipitor 20 mg  Last filled : 10/17/17  LOV: 10/17/17  PCP: [place]: [facility]

## 2017-10-26 MED ORDER — ATORVASTATIN CALCIUM 20 MG PO TABS: 20.0000 mg | ORAL_TABLET | Freq: Every day | ORAL | 3 refills | Status: DC

## 2017-10-26 NOTE — Telephone Encounter (Signed)
Received and reviewed medication refill request.  Request is appropriate and was approved.  Please see medication orders for details.  

## 2017-11-01 DIAGNOSIS — M545 Low back pain: Secondary | ICD-10-CM | POA: Diagnosis not present

## 2017-11-01 MED ORDER — ATORVASTATIN CALCIUM 20 MG PO TABS: 20.0000 mg | ORAL_TABLET | Freq: Every day | ORAL | 3 refills | Status: DC

## 2017-11-01 NOTE — Addendum Note (Signed)
Addended by: Katina Dung on: 11/01/2017 03:41 PM   Modules accepted: Orders

## 2017-11-01 NOTE — Telephone Encounter (Signed)
I have corrected the medication.  Patient is aware.

## 2017-11-01 NOTE — Telephone Encounter (Signed)
Atorvastatin was supposed to go to [facility] [address] and was sent to [facility] order. Can this be fixed?

## 2017-11-13 DIAGNOSIS — M545 Low back pain: Secondary | ICD-10-CM | POA: Diagnosis not present

## 2017-11-14 DIAGNOSIS — M4120 Other idiopathic scoliosis, site unspecified: Secondary | ICD-10-CM | POA: Diagnosis not present

## 2017-11-16 ENCOUNTER — Ambulatory Visit: Payer: Medicare Other | Admitting: Family Medicine

## 2017-11-16 DIAGNOSIS — M545 Low back pain: Secondary | ICD-10-CM | POA: Diagnosis not present

## 2017-11-17 ENCOUNTER — Ambulatory Visit (INDEPENDENT_AMBULATORY_CARE_PROVIDER_SITE_OTHER): Payer: Medicare Other | Admitting: Family Medicine

## 2017-11-17 ENCOUNTER — Encounter: Payer: Self-pay | Admitting: Family Medicine

## 2017-11-17 ENCOUNTER — Other Ambulatory Visit: Payer: Self-pay

## 2017-11-17 VITALS — BP 122/76 | HR 89 | Temp 98.1°F | Resp 16 | Ht 65.25 in | Wt 147.8 lb

## 2017-11-17 DIAGNOSIS — F1120 Opioid dependence, uncomplicated: Secondary | ICD-10-CM

## 2017-11-17 DIAGNOSIS — F1021 Alcohol dependence, in remission: Secondary | ICD-10-CM

## 2017-11-17 DIAGNOSIS — R413 Other amnesia: Secondary | ICD-10-CM | POA: Diagnosis not present

## 2017-11-17 DIAGNOSIS — D649 Anemia, unspecified: Secondary | ICD-10-CM | POA: Diagnosis not present

## 2017-11-17 DIAGNOSIS — R2689 Other abnormalities of gait and mobility: Secondary | ICD-10-CM

## 2017-11-17 LAB — CBC WITH DIFFERENTIAL/PLATELET
Basophils Absolute: 0 10*3/uL (ref 0.0–0.1)
Basophils Relative: 0.3 % (ref 0.0–3.0)
Eosinophils Absolute: 0.1 10*3/uL (ref 0.0–0.7)
Eosinophils Relative: 1.8 % (ref 0.0–5.0)
HCT: 36.5 % (ref 36.0–46.0)
Hemoglobin: 12.3 g/dL (ref 12.0–15.0)
Lymphocytes Relative: 19.2 % (ref 12.0–46.0)
Lymphs Abs: 1.5 10*3/uL (ref 0.7–4.0)
MCHC: 33.7 g/dL (ref 30.0–36.0)
MCV: 91.9 fl (ref 78.0–100.0)
Monocytes Absolute: 0.4 10*3/uL (ref 0.1–1.0)
Monocytes Relative: 5 % (ref 3.0–12.0)
Neutro Abs: 5.7 10*3/uL (ref 1.4–7.7)
Neutrophils Relative %: 73.7 % (ref 43.0–77.0)
Platelets: 456 10*3/uL — ABNORMAL HIGH (ref 150.0–400.0)
RBC: 3.97 Mil/uL (ref 3.87–5.11)
RDW: 12.3 % (ref 11.5–15.5)
WBC: 7.7 10*3/uL (ref 4.0–10.5)

## 2017-11-17 LAB — VITAMIN B12: Vitamin B-12: 523 pg/mL (ref 211–911)

## 2017-11-17 MED ORDER — DULOXETINE HCL 60 MG PO CPEP: 60.0000 mg | ORAL_CAPSULE | Freq: Every day | ORAL | 3 refills | Status: DC

## 2017-11-17 MED ORDER — PANTOPRAZOLE SODIUM 40 MG PO TBEC: 40.0000 mg | DELAYED_RELEASE_TABLET | Freq: Two times a day (BID) | ORAL | 3 refills | Status: DC

## 2017-11-17 NOTE — Progress Notes (Signed)
Subjective  CC:  Chief Complaint  Patient presents with  . Memory Loss    Patient states her memory has bevcome worse     HPI: Shannon Young is a 62 y.o. female who presents to the office today to address the problems listed above in the chief complaint.  Shannon Young is concerned due to problems with short term memory. Hx remarkable for heavy use alcoholism up until about 6-8 weeks ago after an ICU post-op admission due to severe alcohol W/D/DTs. She reports that she noted some memory problems surrounding her recovery; somethings have improved, however, having persistent difficulty remembering things that were just told to her. Mostly noticeable in conversations with her husband: forgetting what he just told her or getting confused mid conversation. Denies problems with distant memory, function, driving, directions. She has been alcohol free. No paresis, diplopia, dysarthria or ataxia although she does admit her balance is a bit off with walking.   Confounders: treatment for chronic pain with chronic narcotics for last 10 years.  She is worried about her memory; as well, she is coping with the realization of her addiction and how it has affected her relationships. Not in counseling.   Assessment  1. Memory impairment   2. Anemia, normocytic normochromic      Plan   memory:  Worrisome for late effects of alcoholism, but needs further evaluation. Could still be recovering from severe metabolic encephalopathy etc ... Will check vitamin levels, order MRI of brain and refer to neuro. Has had neg RPR and TSH during recent hospitalization. Recent cmp/cbc was normal.   Discussed recommendation for counseling as well.   Recommend avoiding any other sedative medications at this time (pt has insomnia)  Follow up: No follow-ups on file.   Orders Placed This Encounter  Procedures  . Vitamin B12  . Folate RBC  . Vitamin B1  . Iron, TIBC and Ferritin Panel  . CBC with Differential/Platelet  .  Ambulatory referral to Neurology   Meds ordered this encounter  Medications  . DULoxetine (CYMBALTA) 60 MG capsule    Sig: Take 1 capsule (60 mg total) by mouth daily.    Dispense:  90 capsule    Refill:  3  . pantoprazole (PROTONIX) 40 MG tablet    Sig: Take 1 tablet (40 mg total) by mouth 2 (two) times daily.    Dispense:  180 tablet    Refill:  3      I reviewed the patients updated PMH, FH, and SocHx.    Patient Active Problem List   Diagnosis Date Noted  . Alcohol dependence in remission ([place]) 10/17/2017    Priority: High  . Insomnia secondary to chronic pain 10/17/2017    Priority: High  . Spinal stenosis of lumbar region 10/17/2017    Priority: High  . Alcoholic hepatitis 16/02/9603    Priority: High  . Essential hypertension 06/29/2015    Priority: High  . Chronic pain syndrome 06/01/2010    Priority: High  . Opioid dependence ([place]) 06/01/2010    Priority: High  . Postlaminectomy syndrome, not elsewhere classified 06/01/2010    Priority: High  . DDD (degenerative disc disease), lumbosacral 03/19/2010    Priority: High  . Family history of ischemic heart disease (IHD) 06/29/2015    Priority: Medium  . GERD (gastroesophageal reflux disease) 05/10/2013    Priority: Medium  . Nonrheumatic mitral valve regurgitation 06/29/2015    Priority: Low  . Depression 10/17/2017  . Delirium tremens ([place]) 10/17/2017  .  Orthostatic hypotension 06/29/2015   Current Meds  Medication Sig  . atorvastatin (LIPITOR) 20 MG tablet Take 1 tablet (20 mg total) by mouth at bedtime.  . Cyanocobalamin (B-12) 1000 MCG CAPS Take by mouth.  . DULoxetine (CYMBALTA) 60 MG capsule Take 1 capsule (60 mg total) by mouth daily.  . fentaNYL (DURAGESIC - DOSED MCG/HR) 100 MCG/HR Place onto the skin.  . fluticasone (FLONASE) 50 MCG/ACT nasal spray Administer 1 spray into both nostrils daily.  . folic acid (FOLVITE) 1 MG tablet Take by mouth.  . losartan (COZAAR) 100 MG tablet Take 1 tablet (100  mg total) by mouth daily.  . Multiple Vitamin (MULTIVITAMIN) capsule Take by mouth.  . naloxone (NARCAN) nasal spray 4 mg/0.1 mL Spray into one nostril for suspected overdose. Repeat x1, if no response after 3 minutes.  Dispense one package with 2 nasal sprays.  Marland Kitchen oxycodone (ROXICODONE) 30 MG immediate release tablet Take 30 mg by mouth 3 (three) times daily.   . pantoprazole (PROTONIX) 40 MG tablet Take 1 tablet (40 mg total) by mouth 2 (two) times daily.  Marland Kitchen thiamine 100 MG tablet Take by mouth.  . [DISCONTINUED] DULoxetine (CYMBALTA) 60 MG capsule Take by mouth.  . [DISCONTINUED] pantoprazole (PROTONIX) 40 MG tablet TAKE ONE TABLET BY MOUTH TWICE DAILY    Allergies: Patient is allergic to gabapentin. Family History: Patient family history includes Alcohol abuse in her brother; Colon cancer in her sister; Healthy in her daughter, daughter, daughter, and son; Hyperlipidemia in her father; Hypertension in her brother, father, and mother. Social History:  Patient  reports that she has quit smoking. She has never used smokeless tobacco. She reports that she drank alcohol. She reports that she has current or past drug history. Drugs: Fentanyl and Oxycodone.  Review of Systems: Constitutional: Negative for fever malaise or anorexia Cardiovascular: negative for chest pain Respiratory: negative for SOB or persistent cough Gastrointestinal: negative for abdominal pain  Objective  Vitals: BP 122/76   Pulse 89   Temp 98.1 F (36.7 C) (Oral)   Resp 16   Ht 5' 5.25" (1.657 m)   Wt 147 lb 12.8 oz (67 kg)   SpO2 98%   BMI 24.41 kg/m  General: no acute distress , A&Ox3 Psych: good grooming; pleasant, normal speech, normal affect although tearful at times, appropriately so HEENT: PEERL, conjunctiva normal, Oropharynx moist,neck is supple Cardiovascular:  RRR without murmur or gallop.  Respiratory:  Good breath sounds bilaterally, CTAB with normal respiratory effort Skin:  Warm, no  rashes Neuro: Cr N 2-12 intact, DTRs 2+ bilaterally throughout, mild tremor - resting and intention, nl HtoS, neg Romberg, minimally wide based gait.   Mini-Cog: 3 of 3 recall; normal clock Animal naming test: 13     Commons side effects, risks, benefits, and alternatives for medications and treatment plan prescribed today were discussed, and the patient expressed understanding of the given instructions. Patient is instructed to call or message via MyChart if he/she has any questions or concerns regarding our treatment plan. No barriers to understanding were identified. We discussed Red Flag symptoms and signs in detail. Patient expressed understanding regarding what to do in case of urgent or emergency type symptoms.   Medication list was reconciled, printed and provided to the patient in AVS. Patient instructions and summary information was reviewed with the patient as documented in the AVS. This note was prepared with assistance of Dragon voice recognition software. Occasional wrong-word or sound-a-like substitutions may have occurred due to the inherent  limitations of voice recognition software

## 2017-11-17 NOTE — Patient Instructions (Addendum)
Please follow up appointment with me as scheduled    We will call you with information regarding your referral appointment to the Neurologist .    I will release your lab results to you on your MyChart account with further instructions. Please reply with any questions.    If you have any questions or concerns, please don't hesitate to send me a message via MyChart or call the office at [phone]. Thank you for visiting with Shannon Young today! It's our pleasure caring for you.

## 2017-11-19 ENCOUNTER — Encounter: Payer: Self-pay | Admitting: Family Medicine

## 2017-11-21 ENCOUNTER — Telehealth: Payer: Self-pay | Admitting: Family Medicine

## 2017-11-21 DIAGNOSIS — M545 Low back pain: Secondary | ICD-10-CM | POA: Diagnosis not present

## 2017-11-21 NOTE — Progress Notes (Signed)
Please call patient: I have reviewed his/her lab results. Lab tests look good. Vitamin levels are OK and anemia has resolved. F/u with neurologist as discussed for further evaluation. And I have ordered a brain MRI to be done as well.

## 2017-11-21 NOTE — Telephone Encounter (Signed)
[facility] did not call the patient within 30 min. LMOVM with number for patient to call and schedule. CRM in system for [facility].

## 2017-11-21 NOTE — Telephone Encounter (Signed)
Spoke with [facility], they will call the pt shortly. I will check the charge in 30 mins, if not scheduled I will call the pt.

## 2017-11-21 NOTE — Telephone Encounter (Unsigned)
Copied from [place] [id number]. Topic: Referral - Status >> Nov 21, 2017 12:01 PM Boyd Kerbs wrote: Reason for CRM:   pt is asking about Neuro and MRI - does she have to schedule or will someone call her.  Dr. Jonni Sanger said something about Psychologist, and has not heard anything on this.

## 2017-11-22 LAB — IRON,TIBC AND FERRITIN PANEL
%SAT: 23 % (calc) (ref 16–45)
Ferritin: 35 ng/mL (ref 16–232)
Iron: 77 ug/dL (ref 45–160)
TIBC: 340 mcg/dL (calc) (ref 250–450)

## 2017-11-22 LAB — FOLATE RBC: RBC Folate: 829 ng/mL RBC (ref >280)

## 2017-11-22 LAB — VITAMIN B1: Vitamin B1 (Thiamine): 283 nmol/L — ABNORMAL HIGH (ref 8–30)

## 2017-11-23 ENCOUNTER — Other Ambulatory Visit: Payer: Self-pay

## 2017-11-23 ENCOUNTER — Encounter: Payer: Self-pay | Admitting: Neurology

## 2017-11-23 ENCOUNTER — Ambulatory Visit (INDEPENDENT_AMBULATORY_CARE_PROVIDER_SITE_OTHER): Payer: Medicare Other | Admitting: Neurology

## 2017-11-23 VITALS — BP 137/80 | HR 75 | Resp 16 | Ht 65.25 in | Wt 152.5 lb

## 2017-11-23 DIAGNOSIS — R413 Other amnesia: Secondary | ICD-10-CM | POA: Insufficient documentation

## 2017-11-23 DIAGNOSIS — F32A Depression, unspecified: Secondary | ICD-10-CM

## 2017-11-23 DIAGNOSIS — F329 Major depressive disorder, single episode, unspecified: Secondary | ICD-10-CM | POA: Diagnosis not present

## 2017-11-23 NOTE — Progress Notes (Signed)
PATIENT: Shannon Young DOB: Aug 16, 1955  Chief Complaint  Patient presents with  . Memory Loss    New Room. PCP Billey Chang.  Difficulty with short term memory onset 1-2 yrs. ago.  Sts. worse since recent back surgery/comatose state.  Sts. she had a difficult time waking from anesthesia and was told this was due to alcohol withdrawal.  Stopped drinking after that and has not had alcohol since April.  MMSE 30 plus 15 animals./fim     HISTORICAL  Shannon Young is a 62 years old, seen in refer by primary care physician Dr. Jonni Sanger, Rosendo Gros evaluation of memory loss, initial evaluation was on November 23, 2017.  Reviewed and summarized the referring note, she had history of multiple low back surgery in the past, still has chronic low back pain, is receiving pain management from physician at [facility], in April 2019, she had her recent low back surgery at [facility], post-surgically, she developed delirium, confusion, difficulty awaking from anesthesia, require prolonged hospital stay.  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, disabled since 2011 due to her low back pain, lumbar surgery, since 2017, she was noted to have gradual onset memory loss, forget conversation,   She also complains of chronic depression, tried different medications without helping her, has been taking Cymbalta 60 mg daily since 2017, still has significant depression, difficulty sleeping, she was taking trazodone 50 mg daily, now cutting back to half tablets every night without helping her memory symptoms,  She is also on polypharmacy for her chronic pain, used to be on much higher dose of oxycodone, doubled her dose that she is taking 30 mg 2 tablets 3 times a day, and fentanyl patch 100 mg every 72 hours,  Laboratory evaluations in June 2019 showed normal CBC, B12 523,,  lipid profile, elevated vitamin B1,  REVIEW OF SYSTEMS: Full 14 system review of systems performed and notable only for as above  ALLERGIES: Allergies  Allergen Reactions  . Gabapentin Other (See Comments)    Pt states that she has jerking movements and arms and fingers go numb Other reaction(s): Numbness Numbness, spasms, weakness     HOME MEDICATIONS: Current Outpatient Medications  Medication Sig Dispense Refill  . atorvastatin (LIPITOR) 20 MG tablet Take 1 tablet (20 mg total) by mouth at bedtime. 90 tablet 3  . DULoxetine (CYMBALTA) 60 MG capsule Take 1 capsule (60 mg total) by mouth daily. 90 capsule 3  . fentaNYL (DURAGESIC - DOSED MCG/HR) 100 MCG/HR Place onto the skin.    . fluticasone (FLONASE) 50 MCG/ACT nasal spray Administer 1 spray into both nostrils daily.    . folic acid (FOLVITE) 1 MG tablet Take by mouth.    . losartan (COZAAR) 100 MG tablet Take 1 tablet (100 mg total) by mouth daily. 90 tablet 1  . Multiple Vitamin (MULTIVITAMIN) capsule Take by mouth.    . naloxone (NARCAN) nasal spray 4 mg/0.1 mL Spray into one nostril for suspected overdose. Repeat x1, if no response after 3 minutes.  Dispense one package with 2 nasal sprays.    Marland Kitchen oxycodone (ROXICODONE) 30 MG immediate release tablet Take 30 mg by mouth 3 (three) times daily.     . pantoprazole (PROTONIX) 40 MG tablet Take 1 tablet (40 mg total) by mouth 2 (two) times daily. 180 tablet 3  . thiamine  100 MG tablet Take by mouth.    . vitamin B-12 (CYANOCOBALAMIN) 500 MCG tablet Take 500 mcg by mouth daily.    . traZODone (DESYREL) 50 MG tablet Take 1 tablet (50 mg total) by mouth at bedtime. (Patient not taking: Reported on 11/17/2017) 90 tablet 1   No current facility-administered medications for this visit.     PAST MEDICAL HISTORY: Past Medical History:  Diagnosis Date  . Alcoholic hepatitis 8/56/3149  . Delirium tremens ([place]) 10/17/2017   Acute alcohol and benzo w/d: 08/2017  . Depression   . GERD  (gastroesophageal reflux disease)   . Heart murmur   . Hyperlipidemia   . Hypertension   . Vision abnormalities     PAST SURGICAL HISTORY: Past Surgical History:  Procedure Laterality Date  . BACK SURGERY  2008,2017,2019   x 3  . BUNIONECTOMY  2003  . MANDIBLE FRACTURE SURGERY  2005  . TOTAL SHOULDER REPLACEMENT  2015    FAMILY HISTORY: Family History  Problem Relation Age of Onset  . Hypertension Mother   . Hypertension Father   . Hyperlipidemia Father   . Colon cancer Sister   . Alcohol abuse Brother   . Hypertension Brother   . Healthy Daughter   . Healthy Son   . Healthy Daughter   . Healthy Daughter     SOCIAL HISTORY:  Social History   Socioeconomic History  . Marital status: Married    Spouse name: Not on file  . Number of children: Not on file  . Years of education: Not on file  . Highest education level: Not on file  Occupational History  . Not on file  Social Needs  . Financial resource strain: Not on file  . Food insecurity:    Worry: Not on file    Inability: Not on file  . Transportation needs:    Medical: Not on file    Non-medical: Not on file  Tobacco Use  . Smoking status: Former Research scientist (life sciences)  . Smokeless tobacco: Never Used  Substance and Sexual Activity  . Alcohol use: Not Currently  . Drug use: Yes    Types: Fentanyl, Oxycodone  . Sexual activity: Yes  Lifestyle  . Physical activity:    Days per week: Not on file    Minutes per session: Not on file  . Stress: Not on file  Relationships  . Social connections:    Talks on phone: Not on file    Gets together: Not on file    Attends religious service: Not on file    Active member of club or organization: Not on file    Attends meetings of clubs or organizations: Not on file    Relationship status: Not on file  . Intimate partner violence:    Fear of current or ex partner: Not on file    Emotionally abused: Not on file    Physically abused: Not on file    Forced sexual activity:  Not on file  Other Topics Concern  . Not on file  Social History Narrative  . Not on file     PHYSICAL EXAM   Vitals:   11/23/17 0758  BP: 137/80  Pulse: 75  Resp: 16  Weight: 152 lb 8 oz (69.2 kg)  Height: 5' 5.25" (1.657 m)    Not recorded      Body mass index is 25.18 kg/m.  PHYSICAL EXAMNIATION:  Gen: NAD, conversant, well nourised, obese, well groomed  Cardiovascular: Regular rate rhythm, no peripheral edema, warm, nontender. Eyes: Conjunctivae clear without exudates or hemorrhage Neck: Supple, no carotid bruits. Pulmonary: Clear to auscultation bilaterally   NEUROLOGICAL EXAM: MMSE - Mini Mental State Exam 11/23/2017  Orientation to time 5  Orientation to Place 5  Registration 3  Attention/ Calculation 5  Recall 3  Language- name 2 objects 2  Language- repeat 1  Language- follow 3 step command 3  Language- read & follow direction 1  Write a sentence 1  Copy design 1  Total score 30  animal naming 15   CRANIAL NERVES: CN II: Visual fields are full to confrontation. Fundoscopic exam is normal with sharp discs and no vascular changes. Pupils are round equal and briskly reactive to light. CN III, IV, VI: extraocular movement are normal. No ptosis. CN V: Facial sensation is intact to pinprick in all 3 divisions bilaterally. Corneal responses are intact.  CN VII: Face is symmetric with normal eye closure and smile. CN VIII: Hearing is normal to rubbing fingers CN IX, X: Palate elevates symmetrically. Phonation is normal. CN XI: Head turning and shoulder shrug are intact CN XII: Tongue is midline with normal movements and no atrophy.  MOTOR: There is no pronator drift of out-stretched arms. Muscle bulk and tone are normal. Muscle strength is normal.  REFLEXES: Reflexes are 2+ and symmetric at the biceps, triceps, knees, and ankles. Plantar responses are flexor.  SENSORY: Intact to light touch, pinprick, positional sensation and  vibratory sensation are intact in fingers and toes.  COORDINATION: Rapid alternating movements and fine finger movements are intact. There is no dysmetria on finger-to-nose and heel-knee-shin.    GAIT/STANCE: Antaglic, cautious Romberg is absent.   DIAGNOSTIC DATA (LABS, IMAGING, TESTING) - I reviewed patient records, labs, notes, testing and imaging myself where available.   ASSESSMENT AND PLAN  Shannon Young is a 62 y.o. female   Mild cognitive impairment  MRI of the brain is pending on November 28, 2017  Check TSH  Her depression, chronic insomnia, chronic pain, polypharmacy, previous long-term alcohol use likely contributed to her complaints as well      Marcial Pacas, M.D. Ph.D.  [facility] [address] [place], [place] [address] Ph: [phone] Fax: [phone]  CC: Leamon Arnt, MD

## 2017-11-24 ENCOUNTER — Telehealth: Payer: Self-pay | Admitting: *Deleted

## 2017-11-24 DIAGNOSIS — M545 Low back pain: Secondary | ICD-10-CM | POA: Diagnosis not present

## 2017-11-24 LAB — TSH: TSH: 2.77 u[IU]/mL (ref 0.450–4.500)

## 2017-11-24 NOTE — Telephone Encounter (Signed)
Spoke to patient she is aware of results

## 2017-11-24 NOTE — Telephone Encounter (Signed)
-----   Message from Marcial Pacas, MD sent at 11/24/2017  9:42 AM EDT ----- Please call patient for normal laboratory result

## 2017-11-28 ENCOUNTER — Ambulatory Visit: Payer: Medicare Other | Admitting: Family Medicine

## 2017-11-28 ENCOUNTER — Ambulatory Visit
Admission: RE | Admit: 2017-11-28 | Discharge: 2017-11-28 | Disposition: A | Discharge status: Home / Self Care | Payer: Medicare Other | Source: Ambulatory Visit | Attending: Family Medicine | Admitting: Family Medicine

## 2017-11-28 DIAGNOSIS — F1021 Alcohol dependence, in remission: Secondary | ICD-10-CM

## 2017-11-28 DIAGNOSIS — M545 Low back pain: Secondary | ICD-10-CM | POA: Diagnosis not present

## 2017-11-28 DIAGNOSIS — R2689 Other abnormalities of gait and mobility: Secondary | ICD-10-CM

## 2017-11-28 DIAGNOSIS — R413 Other amnesia: Secondary | ICD-10-CM

## 2017-11-29 NOTE — Progress Notes (Signed)
Please call patient: I have reviewed his/her lab results. Brain MRI: looks ok. Mild white matter changes but nothing worrisome to clearly explain her memory dysfunction. Will route to neurology for review and follow up. Thanks

## 2017-12-01 DIAGNOSIS — M545 Low back pain: Secondary | ICD-10-CM | POA: Diagnosis not present

## 2017-12-04 ENCOUNTER — Telehealth: Payer: Self-pay | Admitting: Neurology

## 2017-12-04 NOTE — Telephone Encounter (Signed)
Pt has called asking if Dr Krista Blue has received results from pt's GP(Dr Billey Chang) re: MRI done on 07-02.  Pt asking if Dr Krista Blue wants the results.  Please call pt

## 2017-12-05 DIAGNOSIS — M545 Low back pain: Secondary | ICD-10-CM | POA: Diagnosis not present

## 2017-12-05 NOTE — Telephone Encounter (Signed)
Left message letting her know there were no acute abnormalities found on her MRI.  I provided our number to call back to further discuss the details below.

## 2017-12-05 NOTE — Telephone Encounter (Signed)
Please call patient, I was able to review her MRI of the brain showed mild generalized atrophy, supratentorium small vessel disease, there was no acute abnormality.  Will be able to review MRIs films together on January 25, 2018   IMPRESSION: 1. No acute intracranial abnormality. 2. Mild cerebral white matter changes, nonspecific, but most commonly related to chronic microvascular ischemic disease. Primary differential considerations include sequelae of complicated migraine, prior infectious or inflammatory process, or vasculitis. Findings are not typical for demyelinating disease. 3. Small left mastoid effusion, likely benign/sterile. Correlation with physical exam and symptomatology recommended.

## 2017-12-12 DIAGNOSIS — M545 Low back pain: Secondary | ICD-10-CM | POA: Diagnosis not present

## 2017-12-14 ENCOUNTER — Telehealth: Payer: Self-pay | Admitting: Emergency Medicine

## 2017-12-14 DIAGNOSIS — H5203 Hypermetropia, bilateral: Secondary | ICD-10-CM | POA: Diagnosis not present

## 2017-12-14 DIAGNOSIS — H524 Presbyopia: Secondary | ICD-10-CM | POA: Diagnosis not present

## 2017-12-14 DIAGNOSIS — H52221 Regular astigmatism, right eye: Secondary | ICD-10-CM | POA: Diagnosis not present

## 2017-12-14 DIAGNOSIS — H538 Other visual disturbances: Secondary | ICD-10-CM | POA: Diagnosis not present

## 2017-12-14 NOTE — Telephone Encounter (Signed)
Copied from [place] [id number]. Topic: Referral - Status >> Dec 14, 2017  1:46 PM Hewitt Shorts wrote: Pt is needing information regarding the status of her referral request to a pyscologist   Best [phone]

## 2017-12-18 NOTE — Telephone Encounter (Signed)
Spoke with Patient and per your note it discussed recommendations for counseling, patient is unsure of who you wanted her to see.   Please advise.   Doloris Hall,  LPN

## 2017-12-18 NOTE — Telephone Encounter (Signed)
We don't make referrals for therapist. Pt can call [facility]. But can use any therapist in the area.

## 2017-12-18 NOTE — Telephone Encounter (Signed)
Patient informed of Dr. Recommendations, Patient verbalized understanding.   Doloris Hall,  LPN

## 2017-12-20 DIAGNOSIS — M542 Cervicalgia: Secondary | ICD-10-CM | POA: Diagnosis not present

## 2017-12-20 DIAGNOSIS — M5440 Lumbago with sciatica, unspecified side: Secondary | ICD-10-CM | POA: Diagnosis not present

## 2017-12-20 DIAGNOSIS — M545 Low back pain: Secondary | ICD-10-CM | POA: Diagnosis not present

## 2017-12-20 DIAGNOSIS — M5442 Lumbago with sciatica, left side: Secondary | ICD-10-CM | POA: Diagnosis not present

## 2017-12-29 DIAGNOSIS — R531 Weakness: Secondary | ICD-10-CM | POA: Diagnosis not present

## 2017-12-29 DIAGNOSIS — M545 Low back pain: Secondary | ICD-10-CM | POA: Diagnosis not present

## 2017-12-29 DIAGNOSIS — M5417 Radiculopathy, lumbosacral region: Secondary | ICD-10-CM | POA: Diagnosis not present

## 2017-12-29 DIAGNOSIS — M62830 Muscle spasm of back: Secondary | ICD-10-CM | POA: Diagnosis not present

## 2018-01-04 DIAGNOSIS — M545 Low back pain: Secondary | ICD-10-CM | POA: Diagnosis not present

## 2018-01-04 DIAGNOSIS — R531 Weakness: Secondary | ICD-10-CM | POA: Diagnosis not present

## 2018-01-04 DIAGNOSIS — M5417 Radiculopathy, lumbosacral region: Secondary | ICD-10-CM | POA: Diagnosis not present

## 2018-01-04 DIAGNOSIS — M62830 Muscle spasm of back: Secondary | ICD-10-CM | POA: Diagnosis not present

## 2018-01-09 DIAGNOSIS — M5417 Radiculopathy, lumbosacral region: Secondary | ICD-10-CM | POA: Diagnosis not present

## 2018-01-09 DIAGNOSIS — M62830 Muscle spasm of back: Secondary | ICD-10-CM | POA: Diagnosis not present

## 2018-01-09 DIAGNOSIS — R531 Weakness: Secondary | ICD-10-CM | POA: Diagnosis not present

## 2018-01-09 DIAGNOSIS — M545 Low back pain: Secondary | ICD-10-CM | POA: Diagnosis not present

## 2018-01-11 ENCOUNTER — Ambulatory Visit (INDEPENDENT_AMBULATORY_CARE_PROVIDER_SITE_OTHER): Payer: Medicare Other | Admitting: Family Medicine

## 2018-01-11 ENCOUNTER — Encounter: Payer: Self-pay | Admitting: Family Medicine

## 2018-01-11 ENCOUNTER — Other Ambulatory Visit: Payer: Self-pay

## 2018-01-11 VITALS — BP 142/80 | HR 86 | Ht 65.25 in | Wt 155.8 lb

## 2018-01-11 DIAGNOSIS — N3941 Urge incontinence: Secondary | ICD-10-CM | POA: Diagnosis not present

## 2018-01-11 DIAGNOSIS — G8929 Other chronic pain: Secondary | ICD-10-CM

## 2018-01-11 DIAGNOSIS — G894 Chronic pain syndrome: Secondary | ICD-10-CM

## 2018-01-11 DIAGNOSIS — F1120 Opioid dependence, uncomplicated: Secondary | ICD-10-CM | POA: Diagnosis not present

## 2018-01-11 DIAGNOSIS — G4701 Insomnia due to medical condition: Secondary | ICD-10-CM

## 2018-01-11 DIAGNOSIS — I10 Essential (primary) hypertension: Secondary | ICD-10-CM

## 2018-01-11 MED ORDER — TRAZODONE HCL 50 MG PO TABS: 50.0000 mg | ORAL_TABLET | Freq: Every day | ORAL | 5 refills | Status: DC

## 2018-01-11 MED ORDER — TOLTERODINE TARTRATE ER 4 MG PO CP24: 4.0000 mg | ORAL_CAPSULE | Freq: Every day | ORAL | 5 refills | Status: DC

## 2018-01-11 MED ORDER — HYDROCHLOROTHIAZIDE 25 MG PO TABS: 25.0000 mg | ORAL_TABLET | Freq: Every day | ORAL | 3 refills | Status: DC

## 2018-01-11 NOTE — Patient Instructions (Signed)
Please return in 3 months to recheck blood pressure and incontinence.  If you have any questions or concerns, please don't hesitate to send me a message via MyChart or call the office at [phone]. Thank you for visiting with Shannon Young today! It's our pleasure caring for you.  Urinary Incontinence Urinary incontinence is the involuntary loss of urine from your bladder. What are the causes? There are many causes of urinary incontinence. They include:  Medicines.  Infections.  Prostatic enlargement, leading to overflow of urine from your bladder.  Surgery.  Neurological diseases.  Emotional factors.  What are the signs or symptoms? Urinary Incontinence can be divided into four types: 1. Urge incontinence. Urge incontinence is the involuntary loss of urine before you have the opportunity to go to the bathroom. There is a sudden urge to void but not enough time to reach a bathroom. 2. Stress incontinence. Stress incontinence is the sudden loss of urine with any activity that forces urine to pass. It is commonly caused by anatomical changes to the pelvis and sphincter areas of your body. 3. Overflow incontinence. Overflow incontinence is the loss of urine from an obstructed opening to your bladder. This results in a backup of urine and a resultant buildup of pressure within the bladder. When the pressure within the bladder exceeds the closing pressure of the sphincter, the urine overflows, which causes incontinence, similar to water overflowing a dam. 4. Total incontinence. Total incontinence is the loss of urine as a result of the inability to store urine within your bladder.  How is this diagnosed? Evaluating the cause of incontinence may require:  A thorough and complete medical and obstetric history.  A complete physical exam.  Laboratory tests such as a urine culture and sensitivities.  When additional tests are indicated, they can include:  An ultrasound exam.  Kidney and  bladder X-rays.  Cystoscopy. This is an exam of the bladder using a narrow scope.  Urodynamic testing to test the nerve function to the bladder and sphincter areas.  How is this treated? Treatment for urinary incontinence depends on the cause:  For urge incontinence caused by a bacterial infection, antibiotics will be prescribed. If the urge incontinence is related to medicines you take, your health care provider may have you change the medicine.  For stress incontinence, surgery to re-establish anatomical support to the bladder or sphincter, or both, will often correct the condition.  For overflow incontinence caused by an enlarged prostate, an operation to open the channel through the enlarged prostate will allow the flow of urine out of the bladder. In women with fibroids, a hysterectomy may be recommended.  For total incontinence, surgery on your urinary sphincter may help. An artificial urinary sphincter (an inflatable cuff placed around the urethra) may be required. In women who have developed a hole-like passage between their bladder and vagina (vesicovaginal fistula), surgery to close the fistula often is required.  Follow these instructions at home:  Normal daily hygiene and the use of pads or adult diapers that are changed regularly will help prevent odors and skin damage.  Avoid caffeine. It can overstimulate your bladder.  Use the bathroom regularly. Try about every 2-3 hours to go to the bathroom, even if you do not feel the need to do so. Take time to empty your bladder completely. After urinating, wait a minute. Then try to urinate again.  For causes involving nerve dysfunction, keep a log of the medicines you take and a journal of the times you  go to the bathroom. Contact a health care provider if:  You experience worsening of pain instead of improvement in pain after your procedure.  Your incontinence becomes worse instead of better. Get help right away if:  You  experience fever or shaking chills.  You are unable to pass your urine.  You have redness spreading into your groin or down into your thighs. This information is not intended to replace advice given to you by your health care provider. Make sure you discuss any questions you have with your health care provider. Document Released: 06/23/2004 Document Revised: 12/25/2015 Document Reviewed: 10/23/2012 Elsevier Interactive Patient Education  [employer].

## 2018-01-11 NOTE — Progress Notes (Signed)
Subjective  CC:  Chief Complaint  Patient presents with  . Hypertension  . Insomnia    Patient states that she has been having trouble with getting to sleep and staying asleep, she also reports she has nightmares    HPI: Shannon Young is a 62 y.o. female who presents to the office today to address the problems listed above in the chief complaint.  Hypertension f/u: increased losartan to 100 in June. Control is fair . Pt reports she is doing well. taking medications as instructed, no medication side effects noted, home BP monitoring in range of 315V systolic over 76H-60V diastolic, no TIAs, no chest pain on exertion, no dyspnea on exertion, but some swelling of ankles. Tolerating arb.  She denies adverse effects from his BP medications. Compliance with medication is good.   Memory impairment: reviewed neuro's assessment. Brain MRI with mild microvascular white matter changes. Doing a little better. Suspect multipfatcorial: opiates, chronic alcohol use, depression.  Insomnia: persists. Admits to many worries/stressors over her oldest daughter. This worry keeps her awake. Using trazadone 50 with some benefit. Hasn't slept well for many years as expected.  New worsening incontinence, both stress and urgency sxs present w/o irritative sxs. Started kegles recently per pT. Denies vaginal prolapse sxs. ROS: + intermittent fecal incontinence  Pain: remains active. Pain mgt changed to whirlpool tx to see if would help.   Assessment  1. Essential hypertension   2. Insomnia secondary to chronic pain   3. Uncomplicated opioid dependence ([place])   4. Urgency incontinence   5. Chronic pain syndrome      Plan    Hypertension f/u: BP control is fairly well controlled. Will add diuretic to get to goal and help with edema  Insomnia f/u: increase trazadone to 100; counseling, may get better the longer she remains alcohol free. To start psychotherapy to help with stress mgt/anxiety and depression.    Memory impairment: reassured. Should continue to improve over time. To work on Agricultural engineer.   Urinary incontinence: mixed: continue kegel's and add detrol la. F/u and to urology if not improving. See AVS.   Education regarding management of these chronic disease states was given. Management strategies discussed on successive visits include dietary and exercise recommendations, goals of achieving and maintaining IBW, and lifestyle modifications aiming for adequate sleep and minimizing stressors.   Follow up: Return in about 3 months (around 04/13/2018) for follow up Hypertension and urinary incontinence.  No orders of the defined types were placed in this encounter.  Meds ordered this encounter  Medications  . hydrochlorothiazide (HYDRODIURIL) 25 MG tablet    Sig: Take 1 tablet (25 mg total) by mouth daily.    Dispense:  90 tablet    Refill:  3  . traZODone (DESYREL) 50 MG tablet    Sig: Take 1-2 tablets (50-100 mg total) by mouth at bedtime.    Dispense:  180 tablet    Refill:  5  . tolterodine (DETROL LA) 4 MG 24 hr capsule    Sig: Take 1 capsule (4 mg total) by mouth daily.    Dispense:  30 capsule    Refill:  5      BP Readings from Last 3 Encounters:  01/11/18 (!) 142/80  11/23/17 137/80  11/17/17 122/76   Wt Readings from Last 3 Encounters:  01/11/18 155 lb 12.8 oz (70.7 kg)  11/23/17 152 lb 8 oz (69.2 kg)  11/17/17 147 lb 12.8 oz (67 kg)    Lab Results  Component Value Date   CHOL 119 10/17/2017   Lab Results  Component Value Date   HDL 56.60 10/17/2017   Lab Results  Component Value Date   LDLCALC 49 10/17/2017   Lab Results  Component Value Date   TRIG 68.0 10/17/2017   Lab Results  Component Value Date   CHOLHDL 2 10/17/2017   No results found for: LDLDIRECT Lab Results  Component Value Date   CREATININE 0.62 10/17/2017   BUN 9 10/17/2017   NA 134 (L) 10/17/2017   K 3.7 10/17/2017   CL 98 10/17/2017   CO2 28 10/17/2017     The ASCVD Risk score (Goff DC Jr., et al., 2013) failed to calculate for the following reasons:   The valid total cholesterol range is 130 to 320 mg/dL  I reviewed the patients updated PMH, FH, and SocHx.    Patient Active Problem List   Diagnosis Date Noted  . Alcohol dependence in remission ([place]) 10/17/2017    Priority: High  . Insomnia secondary to chronic pain 10/17/2017    Priority: High  . Spinal stenosis of lumbar region 10/17/2017    Priority: High  . Alcoholic hepatitis 11/20/7626    Priority: High  . Essential hypertension 06/29/2015    Priority: High  . Chronic pain syndrome 06/01/2010    Priority: High  . Opioid dependence ([place]) 06/01/2010    Priority: High  . Postlaminectomy syndrome, not elsewhere classified 06/01/2010    Priority: High  . DDD (degenerative disc disease), lumbosacral 03/19/2010    Priority: High  . Family history of ischemic heart disease (IHD) 06/29/2015    Priority: Medium  . GERD (gastroesophageal reflux disease) 05/10/2013    Priority: Medium  . Nonrheumatic mitral valve regurgitation 06/29/2015    Priority: Low  . Memory loss 11/23/2017  . Depression 10/17/2017  . Delirium tremens ([place]) 10/17/2017  . Orthostatic hypotension 06/29/2015    Allergies: Gabapentin  Social History: Patient  reports that she has quit smoking. She has never used smokeless tobacco. She reports that she drank alcohol. She reports that she has current or past drug history. Drugs: Fentanyl and Oxycodone.  Current Meds  Medication Sig  . atorvastatin (LIPITOR) 20 MG tablet Take 1 tablet (20 mg total) by mouth at bedtime.  . DULoxetine (CYMBALTA) 60 MG capsule Take 1 capsule (60 mg total) by mouth daily.  . fentaNYL (DURAGESIC - DOSED MCG/HR) 100 MCG/HR Place onto the skin.  . fluticasone (FLONASE) 50 MCG/ACT nasal spray Administer 1 spray into both nostrils daily.  . folic acid (FOLVITE) 1 MG tablet Take by mouth.  . losartan (COZAAR) 100 MG tablet Take 1  tablet (100 mg total) by mouth daily.  . Multiple Vitamin (MULTIVITAMIN) capsule Take by mouth.  . naloxone (NARCAN) nasal spray 4 mg/0.1 mL Spray into one nostril for suspected overdose. Repeat x1, if no response after 3 minutes.  Dispense one package with 2 nasal sprays.  Marland Kitchen oxycodone (ROXICODONE) 30 MG immediate release tablet Take 30 mg by mouth 3 (three) times daily.   . pantoprazole (PROTONIX) 40 MG tablet Take 1 tablet (40 mg total) by mouth 2 (two) times daily.  Marland Kitchen thiamine 100 MG tablet Take by mouth.  . traZODone (DESYREL) 50 MG tablet Take 1-2 tablets (50-100 mg total) by mouth at bedtime.  . vitamin B-12 (CYANOCOBALAMIN) 500 MCG tablet Take 500 mcg by mouth daily.  . [DISCONTINUED] traZODone (DESYREL) 50 MG tablet Take 1 tablet (50 mg total) by mouth at bedtime.  Review of Systems: Cardiovascular: negative for chest pain, palpitations, leg swelling, orthopnea Respiratory: negative for SOB, wheezing or persistent cough Gastrointestinal: negative for abdominal pain Genitourinary: negative for dysuria or gross hematuria  Objective  Vitals: BP (!) 142/80   Pulse 86   Ht 5' 5.25" (1.657 m)   Wt 155 lb 12.8 oz (70.7 kg)   SpO2 98%   BMI 25.73 kg/m  General: no acute distress  Psych:  Alert and oriented, normal mood and affect HEENT:  Normocephalic, atraumatic, supple neck  Cardiovascular:  RRR without murmur. no edema Respiratory:  Good breath sounds bilaterally, CTAB with normal respiratory effort Skin:  Warm, no rashes Neurologic:   Mental status is normal  Commons side effects, risks, benefits, and alternatives for medications and treatment plan prescribed today were discussed, and the patient expressed understanding of the given instructions. Patient is instructed to call or message via MyChart if he/she has any questions or concerns regarding our treatment plan. No barriers to understanding were identified. We discussed Red Flag symptoms and signs in detail. Patient  expressed understanding regarding what to do in case of urgent or emergency type symptoms.   Medication list was reconciled, printed and provided to the patient in AVS. Patient instructions and summary information was reviewed with the patient as documented in the AVS. This note was prepared with assistance of Dragon voice recognition software. Occasional wrong-word or sound-a-like substitutions may have occurred due to the inherent limitations of voice recognition software

## 2018-01-17 ENCOUNTER — Telehealth: Payer: Self-pay | Admitting: Emergency Medicine

## 2018-01-17 NOTE — Telephone Encounter (Signed)
Prior Authorization Started 01/17/2018 via Cover my Meds.   Shannon Young. LPN

## 2018-01-22 ENCOUNTER — Ambulatory Visit: Payer: Medicare Other | Admitting: Family Medicine

## 2018-01-23 DIAGNOSIS — R531 Weakness: Secondary | ICD-10-CM | POA: Diagnosis not present

## 2018-01-23 DIAGNOSIS — M545 Low back pain: Secondary | ICD-10-CM | POA: Diagnosis not present

## 2018-01-23 DIAGNOSIS — M62830 Muscle spasm of back: Secondary | ICD-10-CM | POA: Diagnosis not present

## 2018-01-23 DIAGNOSIS — M5417 Radiculopathy, lumbosacral region: Secondary | ICD-10-CM | POA: Diagnosis not present

## 2018-01-25 ENCOUNTER — Ambulatory Visit: Payer: Medicare Other | Admitting: Neurology

## 2018-01-25 ENCOUNTER — Encounter: Payer: Self-pay | Admitting: Neurology

## 2018-01-25 DIAGNOSIS — M545 Low back pain: Secondary | ICD-10-CM | POA: Diagnosis not present

## 2018-01-25 DIAGNOSIS — M62830 Muscle spasm of back: Secondary | ICD-10-CM | POA: Diagnosis not present

## 2018-01-25 DIAGNOSIS — R531 Weakness: Secondary | ICD-10-CM | POA: Diagnosis not present

## 2018-01-25 DIAGNOSIS — M5417 Radiculopathy, lumbosacral region: Secondary | ICD-10-CM | POA: Diagnosis not present

## 2018-01-26 ENCOUNTER — Other Ambulatory Visit: Payer: Self-pay

## 2018-01-26 ENCOUNTER — Encounter: Payer: Self-pay | Admitting: Family Medicine

## 2018-01-26 ENCOUNTER — Ambulatory Visit (INDEPENDENT_AMBULATORY_CARE_PROVIDER_SITE_OTHER): Payer: Medicare Other | Admitting: Family Medicine

## 2018-01-26 VITALS — BP 130/78 | HR 103 | Ht 65.25 in | Wt 157.6 lb

## 2018-01-26 DIAGNOSIS — G4701 Insomnia due to medical condition: Secondary | ICD-10-CM

## 2018-01-26 DIAGNOSIS — N3946 Mixed incontinence: Secondary | ICD-10-CM

## 2018-01-26 DIAGNOSIS — F3341 Major depressive disorder, recurrent, in partial remission: Secondary | ICD-10-CM

## 2018-01-26 DIAGNOSIS — Z23 Encounter for immunization: Secondary | ICD-10-CM

## 2018-01-26 DIAGNOSIS — G8929 Other chronic pain: Secondary | ICD-10-CM | POA: Diagnosis not present

## 2018-01-26 LAB — POCT URINALYSIS DIPSTICK
Bilirubin, UA: NEGATIVE
Glucose, UA: NEGATIVE
Ketones, UA: NEGATIVE
Nitrite, UA: NEGATIVE
Protein, UA: NEGATIVE
Spec Grav, UA: 1.015 (ref 1.010–1.025)
Urobilinogen, UA: 0.2 E.U./dL
pH, UA: 6 (ref 5.0–8.0)

## 2018-01-26 NOTE — Patient Instructions (Addendum)
Please return 3 months for your annual complete physical; please come fasting.   If you have any questions or concerns, please don't hesitate to send me a message via MyChart or call the office at [phone]. Thank you for visiting with [place] today! It's our pleasure caring for you.  We will call you with information regarding your referral appointment. Urology.  If you do not hear from [place] within the next 2 weeks, please let me know. It can take 1-2 weeks to get appointments set up with the specialists.

## 2018-01-26 NOTE — Progress Notes (Signed)
Subjective  CC:  Chief Complaint  Patient presents with  . Urinary Incontinence    worse since starting the Detrol LA  . Insomnia    patient states that she is having weird nightmares on Trazadone and feels zoned out   . Immunizations    requests flu shot today     HPI: Shannon Young is a 62 y.o. female who presents to the office today to address the problems listed above in the chief complaint.  Pt is very upset: "feeling very emotional". Worsening incontinence. Has had problems for about 2 years; both stress and urgency sxs present, doing kegles and started detrol la however over last 2 weeks is worsening with severe incontinence. No irritative sxs. No abnl urine odor, suprapubic pain. Has sensation of incontinence but can't get to BR in time.   Insomnia: chronic for most of life - on trazadone but had bad reaction: nightmares and was wondering.   Depression: remains alcohol free; dealing with life stressors. Has made appt with counselor for next month. On antidepressant. Chronic pain plays a role as well.    Assessment  1. Mixed stress and urge urinary incontinence   2. Insomnia secondary to chronic pain   3. Recurrent major depressive disorder, in partial remission ([place])      Plan   Urinary incontinence:  Due to severity and h/o back problems: needs full urologic eval. Referral placed. Check for infection today.  Insomnia: multifactorial. Want to avoid more medications: work on [employer], meditation, mood and sleep hygiene  Depression: agree with counseling; has many life changes and circumstances that could help from therapy.  Flu vaccine today  Follow up: Return in about 3 months (around 04/28/2018) for complete physical.   Orders Placed This Encounter  Procedures  . Ambulatory referral to Urology  . POCT urinalysis dipstick   No orders of the defined types were placed in this encounter.     I reviewed the patients updated PMH, FH, and SocHx.    Patient  Active Problem List   Diagnosis Date Noted  . Alcohol dependence in remission ([place]) 10/17/2017    Priority: High  . Insomnia secondary to chronic pain 10/17/2017    Priority: High  . Spinal stenosis of lumbar region 10/17/2017    Priority: High  . Alcoholic hepatitis 76/28/3151    Priority: High  . Essential hypertension 06/29/2015    Priority: High  . Chronic pain syndrome 06/01/2010    Priority: High  . Opioid dependence ([place]) 06/01/2010    Priority: High  . Postlaminectomy syndrome, not elsewhere classified 06/01/2010    Priority: High  . DDD (degenerative disc disease), lumbosacral 03/19/2010    Priority: High  . Family history of ischemic heart disease (IHD) 06/29/2015    Priority: Medium  . GERD (gastroesophageal reflux disease) 05/10/2013    Priority: Medium  . Nonrheumatic mitral valve regurgitation 06/29/2015    Priority: Low  . Memory loss 11/23/2017  . Depression 10/17/2017  . Delirium tremens ([place]) 10/17/2017  . Orthostatic hypotension 06/29/2015   Current Meds  Medication Sig  . atorvastatin (LIPITOR) 20 MG tablet Take 1 tablet (20 mg total) by mouth at bedtime.  . DULoxetine (CYMBALTA) 60 MG capsule Take 1 capsule (60 mg total) by mouth daily.  . fentaNYL (DURAGESIC - DOSED MCG/HR) 100 MCG/HR Place onto the skin.  . fluticasone (FLONASE) 50 MCG/ACT nasal spray Administer 1 spray into both nostrils daily.  . folic acid (FOLVITE) 1 MG tablet Take by mouth.  Marland Kitchen  hydrochlorothiazide (HYDRODIURIL) 25 MG tablet Take 1 tablet (25 mg total) by mouth daily.  Marland Kitchen losartan (COZAAR) 100 MG tablet Take 1 tablet (100 mg total) by mouth daily.  . Multiple Vitamin (MULTIVITAMIN) capsule Take by mouth.  . naloxone (NARCAN) nasal spray 4 mg/0.1 mL Spray into one nostril for suspected overdose. Repeat x1, if no response after 3 minutes.  Dispense one package with 2 nasal sprays.  Marland Kitchen oxycodone (ROXICODONE) 30 MG immediate release tablet Take 30 mg by mouth 3 (three) times daily.   .  pantoprazole (PROTONIX) 40 MG tablet Take 1 tablet (40 mg total) by mouth 2 (two) times daily.  Marland Kitchen thiamine 100 MG tablet Take by mouth.  . tolterodine (DETROL LA) 4 MG 24 hr capsule Take 1 capsule (4 mg total) by mouth daily.  . vitamin B-12 (CYANOCOBALAMIN) 500 MCG tablet Take 500 mcg by mouth daily.  . [DISCONTINUED] traZODone (DESYREL) 50 MG tablet Take 1-2 tablets (50-100 mg total) by mouth at bedtime.    Allergies: Patient is allergic to gabapentin. Family History: Patient family history includes Alcohol abuse in her brother; Colon cancer in her sister; Healthy in her daughter, daughter, daughter, and son; Hyperlipidemia in her father; Hypertension in her brother, father, and mother. Social History:  Patient  reports that she has quit smoking. She has never used smokeless tobacco. She reports that she drank alcohol. She reports that she has current or past drug history. Drugs: Fentanyl and Oxycodone.  Review of Systems: Constitutional: Negative for fever malaise or anorexia Cardiovascular: negative for chest pain Respiratory: negative for SOB or persistent cough Gastrointestinal: negative for abdominal pain  Objective  Vitals: BP 130/78   Pulse (!) 103   Ht 5' 5.25" (1.657 m)   Wt 157 lb 9.6 oz (71.5 kg)   SpO2 98%   BMI 26.03 kg/m  General: tearful and appears uncomfortable from back pain, standing , A&Ox3 HEENT: PEERL, conjunctiva normal, Oropharynx moist,neck is supple Cardiovascular:  RRR without murmur or gallop.  Respiratory:  Good breath sounds bilaterally, CTAB with normal respiratory effort Skin:  Warm, no rashes     Commons side effects, risks, benefits, and alternatives for medications and treatment plan prescribed today were discussed, and the patient expressed understanding of the given instructions. Patient is instructed to call or message via MyChart if he/she has any questions or concerns regarding our treatment plan. No barriers to understanding were  identified. We discussed Red Flag symptoms and signs in detail. Patient expressed understanding regarding what to do in case of urgent or emergency type symptoms.   Medication list was reconciled, printed and provided to the patient in AVS. Patient instructions and summary information was reviewed with the patient as documented in the AVS. This note was prepared with assistance of Dragon voice recognition software. Occasional wrong-word or sound-a-like substitutions may have occurred due to the inherent limitations of voice recognition software

## 2018-01-28 LAB — URINE CULTURE
MICRO NUMBER:: 91041890
SPECIMEN QUALITY:: ADEQUATE

## 2018-01-30 ENCOUNTER — Telehealth: Payer: Self-pay | Admitting: Emergency Medicine

## 2018-01-30 DIAGNOSIS — M5417 Radiculopathy, lumbosacral region: Secondary | ICD-10-CM | POA: Diagnosis not present

## 2018-01-30 DIAGNOSIS — M545 Low back pain: Secondary | ICD-10-CM | POA: Diagnosis not present

## 2018-01-30 DIAGNOSIS — M62830 Muscle spasm of back: Secondary | ICD-10-CM | POA: Diagnosis not present

## 2018-01-30 DIAGNOSIS — R531 Weakness: Secondary | ICD-10-CM | POA: Diagnosis not present

## 2018-01-30 NOTE — Progress Notes (Signed)
Negative urine culture

## 2018-01-30 NOTE — Telephone Encounter (Signed)
Reason for CRM: Patient calling to see if Dr Jonni Sanger could give Dr Trey Paula with [facility] at [facility] a call to see if she can expedite the patient's appointment. Patient has an appointment at the end of the month and would like it to be sooner if possible. Patient would like a call with an update. CB#: [phone]  Informed Patient that [facility] has their own scheduling. I advised patient to call back and see if they have a cancellation list that she could get on. Patient verbalized understanding.   Doloris Hall,  LPN

## 2018-02-02 DIAGNOSIS — R531 Weakness: Secondary | ICD-10-CM | POA: Diagnosis not present

## 2018-02-02 DIAGNOSIS — M545 Low back pain: Secondary | ICD-10-CM | POA: Diagnosis not present

## 2018-02-02 DIAGNOSIS — M62830 Muscle spasm of back: Secondary | ICD-10-CM | POA: Diagnosis not present

## 2018-02-02 DIAGNOSIS — M5417 Radiculopathy, lumbosacral region: Secondary | ICD-10-CM | POA: Diagnosis not present

## 2018-02-06 DIAGNOSIS — M5417 Radiculopathy, lumbosacral region: Secondary | ICD-10-CM | POA: Diagnosis not present

## 2018-02-06 DIAGNOSIS — R531 Weakness: Secondary | ICD-10-CM | POA: Diagnosis not present

## 2018-02-06 DIAGNOSIS — M545 Low back pain: Secondary | ICD-10-CM | POA: Diagnosis not present

## 2018-02-06 DIAGNOSIS — M62830 Muscle spasm of back: Secondary | ICD-10-CM | POA: Diagnosis not present

## 2018-02-09 DIAGNOSIS — M62830 Muscle spasm of back: Secondary | ICD-10-CM | POA: Diagnosis not present

## 2018-02-09 DIAGNOSIS — R531 Weakness: Secondary | ICD-10-CM | POA: Diagnosis not present

## 2018-02-09 DIAGNOSIS — M545 Low back pain: Secondary | ICD-10-CM | POA: Diagnosis not present

## 2018-02-09 DIAGNOSIS — M5417 Radiculopathy, lumbosacral region: Secondary | ICD-10-CM | POA: Diagnosis not present

## 2018-02-14 DIAGNOSIS — M542 Cervicalgia: Secondary | ICD-10-CM | POA: Diagnosis not present

## 2018-02-14 DIAGNOSIS — M545 Low back pain: Secondary | ICD-10-CM | POA: Diagnosis not present

## 2018-02-14 DIAGNOSIS — M5440 Lumbago with sciatica, unspecified side: Secondary | ICD-10-CM | POA: Diagnosis not present

## 2018-02-14 DIAGNOSIS — M5442 Lumbago with sciatica, left side: Secondary | ICD-10-CM | POA: Diagnosis not present

## 2018-02-16 DIAGNOSIS — R531 Weakness: Secondary | ICD-10-CM | POA: Diagnosis not present

## 2018-02-16 DIAGNOSIS — M545 Low back pain: Secondary | ICD-10-CM | POA: Diagnosis not present

## 2018-02-16 DIAGNOSIS — M62830 Muscle spasm of back: Secondary | ICD-10-CM | POA: Diagnosis not present

## 2018-02-16 DIAGNOSIS — M5417 Radiculopathy, lumbosacral region: Secondary | ICD-10-CM | POA: Diagnosis not present

## 2018-02-21 ENCOUNTER — Ambulatory Visit (INDEPENDENT_AMBULATORY_CARE_PROVIDER_SITE_OTHER): Payer: Medicare Other | Admitting: Psychology

## 2018-02-21 DIAGNOSIS — F341 Dysthymic disorder: Secondary | ICD-10-CM | POA: Diagnosis not present

## 2018-02-23 DIAGNOSIS — M5417 Radiculopathy, lumbosacral region: Secondary | ICD-10-CM | POA: Diagnosis not present

## 2018-02-23 DIAGNOSIS — M545 Low back pain: Secondary | ICD-10-CM | POA: Diagnosis not present

## 2018-02-23 DIAGNOSIS — M62830 Muscle spasm of back: Secondary | ICD-10-CM | POA: Diagnosis not present

## 2018-02-23 DIAGNOSIS — R531 Weakness: Secondary | ICD-10-CM | POA: Diagnosis not present

## 2018-02-27 ENCOUNTER — Encounter

## 2018-02-27 ENCOUNTER — Telehealth: Payer: Self-pay | Admitting: Neurology

## 2018-02-27 ENCOUNTER — Ambulatory Visit (INDEPENDENT_AMBULATORY_CARE_PROVIDER_SITE_OTHER): Payer: Medicare Other | Admitting: Neurology

## 2018-02-27 ENCOUNTER — Encounter: Payer: Self-pay | Admitting: Neurology

## 2018-02-27 VITALS — BP 119/69 | HR 73 | Ht 65.25 in | Wt 158.5 lb

## 2018-02-27 DIAGNOSIS — R202 Paresthesia of skin: Secondary | ICD-10-CM | POA: Diagnosis not present

## 2018-02-27 DIAGNOSIS — R413 Other amnesia: Secondary | ICD-10-CM | POA: Diagnosis not present

## 2018-02-27 DIAGNOSIS — R32 Unspecified urinary incontinence: Secondary | ICD-10-CM | POA: Insufficient documentation

## 2018-02-27 NOTE — Telephone Encounter (Signed)
Medicare order sent to GI. No auth they will reach out to the pt to schedule.  °

## 2018-02-27 NOTE — Progress Notes (Signed)
PATIENT: Shannon Young DOB: 1956/05/16  Chief Complaint  Patient presents with  . Memory Loss/Forgetfulness    She would like to review her MRI results.  No further changes in memory.  Her recent MMSE on 11/23/17 was 30/30.     HISTORICAL  Shannon Young is a 62 years old, seen in refer by primary care physician Dr. Jonni Sanger, Rosendo Gros evaluation of memory loss, initial evaluation was on November 23, 2017.  Reviewed and summarized the referring note, she had history of multiple low back surgery in the past, still has chronic low back pain, is receiving pain management from physician at [facility], in April 2019, she had her recent low back surgery at [facility], post-surgically, she developed delirium, confusion, difficulty awaking from anesthesia, require prolonged hospital stay.  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, disabled since 2011 due to her low back pain, lumbar surgery, since 2017, she was noted to have gradual onset memory loss, forget conversation,   She also complains of chronic depression, tried different medications without helping her, has been taking Cymbalta 60 mg daily since 2017, still has significant depression, difficulty sleeping, she was taking trazodone 50 mg daily, now cutting back to half tablets every night without helping her memory symptoms,  She is also on polypharmacy for her chronic pain, used to be on much higher dose of oxycodone, doubled her dose that she is taking 30 mg 2 tablets 3 times a day, and fentanyl patch 100 mg every 72 hours,  Laboratory evaluations in June 2019 showed normal CBC, B12 523,, lipid profile, elevated vitamin B1  UPDATE Feb 27 2018: She is doing physical therapy, walks regularly, she still have significant low back pain radiating pain lower extremity, she also complains of  incontinence since 2017, gradually getting worse, bowel and bladder incontinence, bilateral feet and 5th finger numbness, occasionally hands since May 2018  She continue complains of sleeping, depression, seen by psychiatrist recently, also complains of word finding difficulties, short-term memory loss,  We have personally reviewed MRI of the brain in July 2019, mild generalized atrophy supratentorium small vessel disease  REVIEW OF SYSTEMS: Full 14 system review of systems performed and notable only for as above  ALLERGIES: Allergies  Allergen Reactions  . Gabapentin Other (See Comments)    Pt states that she has jerking movements and arms and fingers go numb Other reaction(s): Numbness Numbness, spasms, weakness     HOME MEDICATIONS: Current Outpatient Medications  Medication Sig Dispense Refill  . atorvastatin (LIPITOR) 20 MG tablet Take 1 tablet (20 mg total) by mouth at bedtime. 90 tablet 3  . DULoxetine (CYMBALTA) 60 MG capsule Take 1 capsule (60 mg total) by mouth daily. 90 capsule 3  . fentaNYL (DURAGESIC - DOSED MCG/HR) 100 MCG/HR Place onto the skin.    . fluticasone (FLONASE) 50 MCG/ACT nasal spray Place 1 spray into both nostrils as needed.     . folic acid (FOLVITE) 1 MG tablet Take by mouth.    . losartan (COZAAR) 100 MG tablet Take 1 tablet (100 mg total) by mouth daily. 90 tablet 1  . Multiple Vitamin (MULTIVITAMIN) capsule Take by mouth.    . naloxone (NARCAN) nasal spray 4 mg/0.1 mL Spray into one nostril for suspected overdose. Repeat x1, if no response after 3 minutes.  Dispense one package with  2 nasal sprays.    Marland Kitchen oxycodone (ROXICODONE) 30 MG immediate release tablet Take 60 mg by mouth 3 (three) times daily.     . pantoprazole (PROTONIX) 40 MG tablet Take 1 tablet (40 mg total) by mouth 2 (two) times daily. 180 tablet 3  . thiamine 100 MG tablet Take by mouth.    . tolterodine (DETROL LA) 4 MG 24 hr capsule Take 1 capsule (4 mg total) by mouth daily. 30 capsule  5  . vitamin B-12 (CYANOCOBALAMIN) 500 MCG tablet Take 500 mcg by mouth daily.     No current facility-administered medications for this visit.     PAST MEDICAL HISTORY: Past Medical History:  Diagnosis Date  . Alcoholic hepatitis 3/38/2505  . Delirium tremens ([place]) 10/17/2017   Acute alcohol and benzo w/d: 08/2017  . Depression   . GERD (gastroesophageal reflux disease)   . Heart murmur   . Hyperlipidemia   . Hypertension   . Vision abnormalities     PAST SURGICAL HISTORY: Past Surgical History:  Procedure Laterality Date  . BACK SURGERY  2008,2017,2019   x 3  . BUNIONECTOMY  2003  . MANDIBLE FRACTURE SURGERY  2005  . TOTAL SHOULDER REPLACEMENT  2015    FAMILY HISTORY: Family History  Problem Relation Age of Onset  . Hypertension Mother   . Hypertension Father   . Hyperlipidemia Father   . Colon cancer Sister   . Alcohol abuse Brother   . Hypertension Brother   . Healthy Daughter   . Healthy Son   . Healthy Daughter   . Healthy Daughter     SOCIAL HISTORY:  Social History   Socioeconomic History  . Marital status: Married    Spouse name: Not on file  . Number of children: Not on file  . Years of education: Not on file  . Highest education level: Not on file  Occupational History  . Not on file  Social Needs  . Financial resource strain: Not on file  . Food insecurity:    Worry: Not on file    Inability: Not on file  . Transportation needs:    Medical: Not on file    Non-medical: Not on file  Tobacco Use  . Smoking status: Former Research scientist (life sciences)  . Smokeless tobacco: Never Used  Substance and Sexual Activity  . Alcohol use: Not Currently  . Drug use: Yes    Types: Fentanyl, Oxycodone  . Sexual activity: Yes  Lifestyle  . Physical activity:    Days per week: Not on file    Minutes per session: Not on file  . Stress: Not on file  Relationships  . Social connections:    Talks on phone: Not on file    Gets together: Not on file    Attends religious  service: Not on file    Active member of club or organization: Not on file    Attends meetings of clubs or organizations: Not on file    Relationship status: Not on file  . Intimate partner violence:    Fear of current or ex partner: Not on file    Emotionally abused: Not on file    Physically abused: Not on file    Forced sexual activity: Not on file  Other Topics Concern  . Not on file  Social History Narrative  . Not on file     PHYSICAL EXAM   Vitals:   02/27/18 1312  BP: 119/69  Pulse: 73  Weight: 158 lb 8  oz (71.9 kg)  Height: 5' 5.25" (1.657 m)    Not recorded      Body mass index is 26.17 kg/m.  PHYSICAL EXAMNIATION:  Gen: NAD, conversant, well nourised, obese, well groomed                     Cardiovascular: Regular rate rhythm, no peripheral edema, warm, nontender. Eyes: Conjunctivae clear without exudates or hemorrhage Neck: Supple, no carotid bruits. Pulmonary: Clear to auscultation bilaterally   NEUROLOGICAL EXAM: MMSE - Mini Mental State Exam 11/23/2017  Orientation to time 5  Orientation to Place 5  Registration 3  Attention/ Calculation 5  Recall 3  Language- name 2 objects 2  Language- repeat 1  Language- follow 3 step command 3  Language- read & follow direction 1  Write a sentence 1  Copy design 1  Total score 30  animal naming 15   CRANIAL NERVES: CN II: Visual fields are full to confrontation. Fundoscopic exam is normal with sharp discs and no vascular changes. Pupils are round equal and briskly reactive to light. CN III, IV, VI: extraocular movement are normal. No ptosis. CN V: Facial sensation is intact to pinprick in all 3 divisions bilaterally. Corneal responses are intact.  CN VII: Face is symmetric with normal eye closure and smile. CN VIII: Hearing is normal to rubbing fingers CN IX, X: Palate elevates symmetrically. Phonation is normal. CN XI: Head turning and shoulder shrug are intact CN XII: Tongue is midline with normal  movements and no atrophy.  MOTOR: There is no pronator drift of out-stretched arms. Muscle bulk and tone are normal. Muscle strength is normal.  REFLEXES: Reflexes are 2+ and symmetric at the biceps, triceps, knees, and ankles. Plantar responses are flexor.  SENSORY: Intact to light touch, pinprick, positional sensation and vibratory sensation are intact in fingers and toes.  COORDINATION: Rapid alternating movements and fine finger movements are intact. There is no dysmetria on finger-to-nose and heel-knee-shin.    GAIT/STANCE: Antaglic, cautious Romberg is absent.   DIAGNOSTIC DATA (LABS, IMAGING, TESTING) - I reviewed patient records, labs, notes, testing and imaging myself where available.   ASSESSMENT AND PLAN  Akiva Josey is a 62 y.o. female   Mild cognitive impairment  MRI of the brain showed generalized atrophy supratentorium small vessel disease  Laboratory evaluation showed normal TSH, CBC, iron panel, B12,  Her depression, chronic insomnia, chronic pain, polypharmacy, previous long-term alcohol use all likely contributed to her complaints of memory loss  Bilateral hands paresthesia, urinary and bowel incontinence  She has hyperreflexia,  Need to rule out cervical spondylitic  Proceed with MRI of cervical spine    Marcial Pacas, M.D. Ph.D.  [facility] [address] [place], [place] [address] Ph: [phone] Fax: [phone]  CC: Leamon Arnt, MD

## 2018-02-28 DIAGNOSIS — N3942 Incontinence without sensory awareness: Secondary | ICD-10-CM | POA: Diagnosis not present

## 2018-02-28 DIAGNOSIS — R351 Nocturia: Secondary | ICD-10-CM | POA: Diagnosis not present

## 2018-02-28 DIAGNOSIS — N3946 Mixed incontinence: Secondary | ICD-10-CM | POA: Diagnosis not present

## 2018-02-28 DIAGNOSIS — R350 Frequency of micturition: Secondary | ICD-10-CM | POA: Diagnosis not present

## 2018-03-05 ENCOUNTER — Ambulatory Visit
Admission: RE | Admit: 2018-03-05 | Discharge: 2018-03-05 | Disposition: A | Discharge status: Home / Self Care | Payer: Medicare Other | Source: Ambulatory Visit | Attending: Neurology | Admitting: Neurology

## 2018-03-05 DIAGNOSIS — R32 Unspecified urinary incontinence: Secondary | ICD-10-CM

## 2018-03-05 DIAGNOSIS — R413 Other amnesia: Secondary | ICD-10-CM

## 2018-03-05 DIAGNOSIS — R202 Paresthesia of skin: Secondary | ICD-10-CM

## 2018-03-05 DIAGNOSIS — R200 Anesthesia of skin: Secondary | ICD-10-CM | POA: Diagnosis not present

## 2018-03-06 ENCOUNTER — Telehealth: Payer: Self-pay | Admitting: Neurology

## 2018-03-06 DIAGNOSIS — M545 Low back pain: Secondary | ICD-10-CM | POA: Diagnosis not present

## 2018-03-06 DIAGNOSIS — R531 Weakness: Secondary | ICD-10-CM | POA: Diagnosis not present

## 2018-03-06 DIAGNOSIS — M5417 Radiculopathy, lumbosacral region: Secondary | ICD-10-CM | POA: Diagnosis not present

## 2018-03-06 DIAGNOSIS — M62830 Muscle spasm of back: Secondary | ICD-10-CM | POA: Diagnosis not present

## 2018-03-06 NOTE — Telephone Encounter (Signed)
Please call patient, MRI cervical spine showed mild cervical degenerative changes, no evidence of significant canal or foraminal stenosis.  IMPRESSION: Abnormal MRI scan of cervical spine showing abnormal alignment as well as prominent spondylitic changes from C4-C7 most prominent at C6-7 where there is bilateral foraminal as well as mild canal stenosis.

## 2018-03-06 NOTE — Telephone Encounter (Signed)
Spoke to patient - she is aware of her results and verbalized understanding.

## 2018-03-07 ENCOUNTER — Ambulatory Visit (INDEPENDENT_AMBULATORY_CARE_PROVIDER_SITE_OTHER): Payer: Medicare Other | Admitting: Psychology

## 2018-03-07 DIAGNOSIS — F341 Dysthymic disorder: Secondary | ICD-10-CM | POA: Diagnosis not present

## 2018-03-08 DIAGNOSIS — R531 Weakness: Secondary | ICD-10-CM | POA: Diagnosis not present

## 2018-03-08 DIAGNOSIS — M5417 Radiculopathy, lumbosacral region: Secondary | ICD-10-CM | POA: Diagnosis not present

## 2018-03-08 DIAGNOSIS — M545 Low back pain: Secondary | ICD-10-CM | POA: Diagnosis not present

## 2018-03-08 DIAGNOSIS — M62830 Muscle spasm of back: Secondary | ICD-10-CM | POA: Diagnosis not present

## 2018-03-20 DIAGNOSIS — N3942 Incontinence without sensory awareness: Secondary | ICD-10-CM | POA: Diagnosis not present

## 2018-03-20 DIAGNOSIS — N3946 Mixed incontinence: Secondary | ICD-10-CM | POA: Diagnosis not present

## 2018-03-20 DIAGNOSIS — R350 Frequency of micturition: Secondary | ICD-10-CM | POA: Diagnosis not present

## 2018-03-20 DIAGNOSIS — R351 Nocturia: Secondary | ICD-10-CM | POA: Diagnosis not present

## 2018-03-21 ENCOUNTER — Ambulatory Visit (INDEPENDENT_AMBULATORY_CARE_PROVIDER_SITE_OTHER): Payer: Medicare Other | Admitting: Psychology

## 2018-03-21 DIAGNOSIS — F341 Dysthymic disorder: Secondary | ICD-10-CM | POA: Diagnosis not present

## 2018-03-26 NOTE — Telephone Encounter (Signed)
Patient calling to get MRI results. She does not remember getting them before.

## 2018-03-26 NOTE — Telephone Encounter (Signed)
Spoke to patient  - she wanted her cervical MRI results again.  The results below were discussed with her and she verbalized understanding.

## 2018-03-27 ENCOUNTER — Ambulatory Visit: Payer: Medicare Other | Admitting: Psychiatry

## 2018-03-29 ENCOUNTER — Ambulatory Visit (INDEPENDENT_AMBULATORY_CARE_PROVIDER_SITE_OTHER): Payer: Medicare Other | Admitting: Psychiatry

## 2018-03-29 DIAGNOSIS — F339 Major depressive disorder, recurrent, unspecified: Secondary | ICD-10-CM

## 2018-04-03 DIAGNOSIS — M4120 Other idiopathic scoliosis, site unspecified: Secondary | ICD-10-CM | POA: Diagnosis not present

## 2018-04-05 ENCOUNTER — Ambulatory Visit: Payer: Medicare Other | Admitting: Neurology

## 2018-04-06 DIAGNOSIS — N3942 Incontinence without sensory awareness: Secondary | ICD-10-CM | POA: Diagnosis not present

## 2018-04-06 DIAGNOSIS — N3946 Mixed incontinence: Secondary | ICD-10-CM | POA: Diagnosis not present

## 2018-04-10 ENCOUNTER — Ambulatory Visit (INDEPENDENT_AMBULATORY_CARE_PROVIDER_SITE_OTHER): Payer: Medicare Other | Admitting: Psychiatry

## 2018-04-10 DIAGNOSIS — F339 Major depressive disorder, recurrent, unspecified: Secondary | ICD-10-CM | POA: Diagnosis not present

## 2018-04-11 DIAGNOSIS — M542 Cervicalgia: Secondary | ICD-10-CM | POA: Diagnosis not present

## 2018-04-11 DIAGNOSIS — M545 Low back pain: Secondary | ICD-10-CM | POA: Diagnosis not present

## 2018-04-11 DIAGNOSIS — M5440 Lumbago with sciatica, unspecified side: Secondary | ICD-10-CM | POA: Diagnosis not present

## 2018-04-11 DIAGNOSIS — M961 Postlaminectomy syndrome, not elsewhere classified: Secondary | ICD-10-CM | POA: Diagnosis not present

## 2018-04-16 ENCOUNTER — Ambulatory Visit (INDEPENDENT_AMBULATORY_CARE_PROVIDER_SITE_OTHER): Payer: Medicare Other | Admitting: Family Medicine

## 2018-04-16 ENCOUNTER — Ambulatory Visit: Payer: Self-pay | Admitting: *Deleted

## 2018-04-16 ENCOUNTER — Other Ambulatory Visit: Payer: Self-pay

## 2018-04-16 ENCOUNTER — Encounter: Payer: Self-pay | Admitting: Family Medicine

## 2018-04-16 VITALS — BP 118/68 | HR 106 | Temp 97.6°F | Resp 14 | Ht 65.25 in | Wt 152.0 lb

## 2018-04-16 DIAGNOSIS — K529 Noninfective gastroenteritis and colitis, unspecified: Secondary | ICD-10-CM

## 2018-04-16 DIAGNOSIS — F3341 Major depressive disorder, recurrent, in partial remission: Secondary | ICD-10-CM

## 2018-04-16 DIAGNOSIS — R159 Full incontinence of feces: Secondary | ICD-10-CM

## 2018-04-16 DIAGNOSIS — F1120 Opioid dependence, uncomplicated: Secondary | ICD-10-CM

## 2018-04-16 MED ORDER — DULOXETINE HCL 60 MG PO CPEP: 60.0000 mg | ORAL_CAPSULE | Freq: Every day | ORAL | 3 refills | Status: DC

## 2018-04-16 MED ORDER — BUPROPION HCL ER (SR) 150 MG PO TB12: 150.0000 mg | ORAL_TABLET | Freq: Two times a day (BID) | ORAL | 2 refills | Status: DC

## 2018-04-16 MED ORDER — ONDANSETRON 4 MG PO TBDP: 4.0000 mg | ORAL_TABLET | Freq: Three times a day (TID) | ORAL | 0 refills | Status: DC | PRN

## 2018-04-16 NOTE — Telephone Encounter (Signed)
Provider is aware that appointment has been made.

## 2018-04-16 NOTE — Telephone Encounter (Signed)
Pt calling stating that she has been vomiting and since Friday and is unable to hold anything down. Pt states she is currently eating ice chips. Pt also states she has been having frequent soft stools and states that sometimes she is not able to tel when she goes and is having incontinent episodes in her pants.Pt states that the stools are not loose or watery. Pt states that she has been experiencing abdominal pain and is currently rating it at 4. Pt states she has had 3 episodes of vomiting today since 5:15am. Pt states that she went to see a urologist a week ago from Friday due to incontinence and was prescribed Myrbetriq. Pt states she was told that this medication should help with her stools as well as incontinence but pt states that she is having stools. Pt scheduled for appt today and advised that if symptoms become worse before scheduled appt to return call to the office. Pt verbalized understanding.  Reason for Disposition . [1] MILD or MODERATE vomiting AND [2] present > 48 hours (2 days) (Exception: mild vomiting with associated diarrhea)  Answer Assessment - Initial Assessment Questions 1. VOMITING SEVERITY: "How many times have you vomited in the past 24 hours?"     - MILD:  1 - 2 times/day    - MODERATE: 3 - 5 times/day, decreased oral intake without significant weight loss or symptoms of dehydration    - SEVERE: 6 or more times/day, vomits everything or nearly everything, with significant weight loss, symptoms of dehydration      3 times today 2. ONSET: "When did the vomiting begin?"      Begin on Friday 3. FLUIDS: "What fluids or food have you vomited up today?" "Have you been able to keep any fluids down?"     Not able to keep fluids or food down 4. ABDOMINAL PAIN: "Are your having any abdominal pain?" If yes : "How bad is it and what does it feel like?" (e.g., crampy, dull, intermittent, constant)      Yes, rates at 4 and cramps at times 5. DIARRHEA: "Is there any diarrhea?" If so,  ask: "How many times today?"      Pt states that she is not having diarrhea but is having incontinent stools 6. CONTACTS: "Is there anyone else in the family with the same symptoms?"      No 7. CAUSE: "What do you think is causing your vomiting?"     Unknown 8. HYDRATION STATUS: "Any signs of dehydration?" (e.g., dry mouth [not only dry lips], too weak to stand) "When did you last urinate?"    Yes was able to urinate, but did voice concerns of getting dehydrated 9. OTHER SYMPTOMS: "Do you have any other symptoms?" (e.g., fever, headache, vertigo, vomiting blood or coffee grounds, recent head injury)    Really bad headache all night last night 10. PREGNANCY: "Is there any chance you are pregnant?" "When was your last menstrual period?"       n/a  Protocols used: [facility]

## 2018-04-16 NOTE — Patient Instructions (Signed)
Please return in 4 weeks for recheck of mood.  If you have any questions or concerns, please don't hesitate to send me a message via MyChart or call the office at [phone]. Thank you for visiting with [place] today! It's our pleasure caring for you.  I have referred you to GI for your fecal incontinence.  We are starting wellbutrin in addition to cymbalta but cymbalta is a once a day medication.  I've ordered nausea medications.  Hydrate and try immodium.  Return if you are not improving next week.    Viral Gastroenteritis, Adult Viral gastroenteritis is also known as the stomach flu. This condition is caused by certain germs (viruses). These germs can be passed from person to person very easily (are very contagious). This condition can cause sudden watery poop (diarrhea), fever, and throwing up (vomiting). Having watery poop and throwing up can make you feel weak and cause you to get dehydrated. Dehydration can make you tired and thirsty, make you have a dry mouth, and make it so you pee (urinate) less often. Older adults and people with other diseases or a weak defense system (immune system) are at higher risk for dehydration. It is important to replace the fluids that you lose from having watery poop and throwing up. Follow these instructions at home: Follow instructions from your doctor about how to care for yourself at home. Eating and drinking  Follow these instructions as told by your doctor:  Take an oral rehydration solution (ORS). This is a drink that is sold at pharmacies and stores.  Drink clear fluids in small amounts as you are able, such as: ? Water. ? Ice chips. ? Diluted fruit juice. ? Low-calorie sports drinks.  Eat bland, easy-to-digest foods in small amounts as you are able, such as: ? Bananas. ? Applesauce. ? Rice. ? Low-fat (lean) meats. ? Toast. ? Crackers.  Avoid fluids that have a lot of sugar or caffeine in them.  Avoid alcohol.  Avoid spicy or fatty  foods.  General instructions  Drink enough fluid to keep your pee (urine) clear or pale yellow.  Wash your hands often. If you cannot use soap and water, use hand sanitizer.  Make sure that all people in your home wash their hands well and often.  Rest at home while you get better.  Take over-the-counter and prescription medicines only as told by your doctor.  Watch your condition for any changes.  Take a warm bath to help with any burning or pain from having watery poop.  Keep all follow-up visits as told by your doctor. This is important. Contact a doctor if:  You cannot keep fluids down.  Your symptoms get worse.  You have new symptoms.  You feel light-headed or dizzy.  You have muscle cramps. Get help right away if:  You have chest pain.  You feel very weak or you pass out (faint).  You see blood in your throw-up.  Your throw-up looks like coffee grounds.  You have bloody or black poop (stools) or poop that look like tar.  You have a very bad headache, a stiff neck, or both.  You have a rash.  You have very bad pain, cramping, or bloating in your belly (abdomen).  You have trouble breathing.  You are breathing very quickly.  Your heart is beating very quickly.  Your skin feels cold and clammy.  You feel confused.  You have pain when you pee.  You have signs of dehydration, such as: ?  Dark pee, hardly any pee, or no pee. ? Cracked lips. ? Dry mouth. ? Sunken eyes. ? Sleepiness. ? Weakness. This information is not intended to replace advice given to you by your health care provider. Make sure you discuss any questions you have with your health care provider. Document Released: 11/02/2007 Document Revised: 12/04/2015 Document Reviewed: 01/20/2015 Elsevier Interactive Patient Education  2017 [employer].

## 2018-04-16 NOTE — Progress Notes (Signed)
Subjective  CC:  Chief Complaint  Patient presents with  . Emesis    X's 3 days, chills right before vomiting then clears  . Encopresis    HPI: Shannon Young is a 62 y.o. female who presents to the office today to address the problems listed above in the chief complaint.  62 year old presents with repetitive vomiting x3 days and multiple stools daily for the last 5 days.  Reports abdominal cramping and chills but not having fevers to her knowledge.  No myalgias.  Has not been able to keep down much fluid or food but is eating ice chips.  Had soup today.  Denies symptoms of dehydration specifically lightheadedness or palpitations.  She denies blood in her stools.  She does have epigastric pain and is on a PPI.  Depression: She has been taking Cymbalta 60 mg twice daily.  For my records and chart review she should be on 60 mg daily.  She is uncertain when this was started but she believes it was 3 to 4 years ago.  It is possible that it was started by her pain management doctor for her chronic pain syndrome.  She reports her mood is low.  Feels depressed.  No suicidal ideation.  She started seeing a counselor last month.  See last visits with regard to her emotional lability.  Psychiatric health is complicated by history of alcohol dependence in remission for the last 6 months.  Also complicated by chronic narcotic dependence for chronic pain syndrome that is uncontrolled.  Fecal incontinence: This is worsening as well.  Has decreased sensation of having to move bowels. Assessment  1. Gastroenteritis   2. Recurrent major depressive disorder, in partial remission ([place])   3. Incontinence of feces, unspecified fecal incontinence type   4. Uncomplicated opioid dependence ([place])      Plan   Gastroenteritis: We will treat for viral etiology with supportive care, Zofran given in office, ordered to be taken at home so the patient can hydrate and eat.  Imodium as needed.  Follow-up next week in  office if not improving.  Benign abdomen currently.  Monitor for fevers or worsening abdominal pain.  Recurrent active major depression: Counseled on recommended dose of Cymbalta.  Recommend continuing at 60 mg daily and adding Wellbutrin.  Recheck in 4 weeks.  Patient declined psychiatric referral at this time.  Sorting through her mood problem is complicated as noted above.  Fecal incontinence: Referred to gastroenterology.  Follow up: Return in about 4 weeks (around 05/14/2018) for recheck mood.   Orders Placed This Encounter  Procedures  . Comprehensive metabolic panel  . CBC with Differential/Platelet  . Ambulatory referral to Gastroenterology   Meds ordered this encounter  Medications  . buPROPion (WELLBUTRIN SR) 150 MG 12 hr tablet    Sig: Take 1 tablet (150 mg total) by mouth 2 (two) times daily.    Dispense:  60 tablet    Refill:  2  . DULoxetine (CYMBALTA) 60 MG capsule    Sig: Take 1 capsule (60 mg total) by mouth daily.    Dispense:  90 capsule    Refill:  3    Please fill early; pt was taking it bid and is now out. Need to keep her on it to prevent withdrawal  . ondansetron (ZOFRAN ODT) 4 MG disintegrating tablet    Sig: Take 1 tablet (4 mg total) by mouth every 8 (eight) hours as needed for nausea or vomiting.    Dispense:  20  tablet    Refill:  0      I reviewed the patients updated PMH, FH, and SocHx.    Patient Active Problem List   Diagnosis Date Noted  . Alcohol dependence in remission ([place]) 10/17/2017    Priority: High  . Insomnia secondary to chronic pain 10/17/2017    Priority: High  . Spinal stenosis of lumbar region 10/17/2017    Priority: High  . Alcoholic hepatitis 16/02/9603    Priority: High  . Essential hypertension 06/29/2015    Priority: High  . Chronic pain syndrome 06/01/2010    Priority: High  . Opioid dependence ([place]) 06/01/2010    Priority: High  . Postlaminectomy syndrome, not elsewhere classified 06/01/2010    Priority: High  .  DDD (degenerative disc disease), lumbosacral 03/19/2010    Priority: High  . Family history of ischemic heart disease (IHD) 06/29/2015    Priority: Medium  . GERD (gastroesophageal reflux disease) 05/10/2013    Priority: Medium  . Nonrheumatic mitral valve regurgitation 06/29/2015    Priority: Low  . Urinary incontinence 02/27/2018  . Paresthesia 02/27/2018  . Memory loss 11/23/2017  . Depression 10/17/2017  . Delirium tremens ([place]) 10/17/2017  . Orthostatic hypotension 06/29/2015   Current Meds  Medication Sig  . atorvastatin (LIPITOR) 20 MG tablet Take 1 tablet (20 mg total) by mouth at bedtime.  Marland Kitchen buPROPion (WELLBUTRIN SR) 150 MG 12 hr tablet Take 1 tablet (150 mg total) by mouth 2 (two) times daily.  . DULoxetine (CYMBALTA) 60 MG capsule Take 1 capsule (60 mg total) by mouth daily.  . fentaNYL (DURAGESIC - DOSED MCG/HR) 100 MCG/HR Place onto the skin.  . fluticasone (FLONASE) 50 MCG/ACT nasal spray Place 1 spray into both nostrils as needed.   . folic acid (FOLVITE) 1 MG tablet Take by mouth.  . losartan (COZAAR) 100 MG tablet Take 1 tablet (100 mg total) by mouth daily.  . mirabegron ER (MYRBETRIQ) 25 MG TB24 tablet Take 25 mg by mouth daily.  . Multiple Vitamin (MULTIVITAMIN) capsule Take by mouth.  . naloxone (NARCAN) nasal spray 4 mg/0.1 mL Spray into one nostril for suspected overdose. Repeat x1, if no response after 3 minutes.  Dispense one package with 2 nasal sprays.  . ondansetron (ZOFRAN ODT) 4 MG disintegrating tablet Take 1 tablet (4 mg total) by mouth every 8 (eight) hours as needed for nausea or vomiting.  Marland Kitchen oxycodone (ROXICODONE) 30 MG immediate release tablet Take 60 mg by mouth 3 (three) times daily.   . pantoprazole (PROTONIX) 40 MG tablet Take 1 tablet (40 mg total) by mouth 2 (two) times daily.  Marland Kitchen thiamine 100 MG tablet Take by mouth.  . vitamin B-12 (CYANOCOBALAMIN) 500 MCG tablet Take 500 mcg by mouth daily.  . [DISCONTINUED] DULoxetine (CYMBALTA) 60 MG  capsule Take 1 capsule (60 mg total) by mouth daily.  . [DISCONTINUED] tolterodine (DETROL LA) 4 MG 24 hr capsule Take 1 capsule (4 mg total) by mouth daily.    Allergies: Patient is allergic to gabapentin. Family History: Patient family history includes Alcohol abuse in her brother; Colon cancer in her sister; Healthy in her daughter, daughter, daughter, and son; Hyperlipidemia in her father; Hypertension in her brother, father, and mother. Social History:  Patient  reports that she has quit smoking. She has never used smokeless tobacco. She reports that she drank alcohol. She reports that she has current or past drug history. Drugs: Fentanyl and Oxycodone.  Review of Systems: Constitutional: Negative for fever malaise  or anorexia Cardiovascular: negative for chest pain Respiratory: negative for SOB or persistent cough Gastrointestinal: Positive for abdominal pain, upper with cramping  Objective  Vitals: Pulse (!) 106   Temp 97.6 F (36.4 C) (Oral)   Resp 14   Ht 5' 5.25" (1.657 m)   Wt 152 lb (68.9 kg)   SpO2 98%   BMI 25.10 kg/m  General: Appears uncomfortable but nontoxic, A&Ox3 HEENT: PEERL, conjunctiva normal, Oropharynx moist,neck is supple Cardiovascular:  RRR without murmur or gallop.  Respiratory:  Good breath sounds bilaterally, CTAB with normal respiratory effort Gastrointestinal: soft, flat abdomen, hyperactive bowel sounds, no palpable masses, no hepatosplenomegaly, no appreciated hernias, no rebound tenderness or guarding Skin:  Warm, no rashes     Commons side effects, risks, benefits, and alternatives for medications and treatment plan prescribed today were discussed, and the patient expressed understanding of the given instructions. Patient is instructed to call or message via MyChart if he/she has any questions or concerns regarding our treatment plan. No barriers to understanding were identified. We discussed Red Flag symptoms and signs in detail. Patient  expressed understanding regarding what to do in case of urgent or emergency type symptoms.   Medication list was reconciled, printed and provided to the patient in AVS. Patient instructions and summary information was reviewed with the patient as documented in the AVS. This note was prepared with assistance of Dragon voice recognition software. Occasional wrong-word or sound-a-like substitutions may have occurred due to the inherent limitations of voice recognition software

## 2018-04-17 ENCOUNTER — Ambulatory Visit (INDEPENDENT_AMBULATORY_CARE_PROVIDER_SITE_OTHER): Payer: Medicare Other | Admitting: Psychiatry

## 2018-04-17 DIAGNOSIS — F339 Major depressive disorder, recurrent, unspecified: Secondary | ICD-10-CM

## 2018-04-17 LAB — COMPREHENSIVE METABOLIC PANEL
AG Ratio: 2 (calc) (ref 1.0–2.5)
ALT: 34 U/L — ABNORMAL HIGH (ref 6–29)
AST: 16 U/L (ref 10–35)
Albumin: 4.7 g/dL (ref 3.6–5.1)
Alkaline phosphatase (APISO): 91 U/L (ref 33–130)
BUN: 11 mg/dL (ref 7–25)
CO2: 31 mmol/L (ref 20–32)
Calcium: 9.9 mg/dL (ref 8.6–10.4)
Chloride: 97 mmol/L — ABNORMAL LOW (ref 98–110)
Creat: 0.54 mg/dL (ref 0.50–0.99)
Globulin: 2.3 g/dL (calc) (ref 1.9–3.7)
Glucose, Bld: 124 mg/dL — ABNORMAL HIGH (ref 65–99)
Potassium: 4.1 mmol/L (ref 3.5–5.3)
Sodium: 138 mmol/L (ref 135–146)
Total Bilirubin: 0.5 mg/dL (ref 0.2–1.2)
Total Protein: 7 g/dL (ref 6.1–8.1)

## 2018-04-17 LAB — CBC WITH DIFFERENTIAL/PLATELET
Basophils Absolute: 32 cells/uL (ref 0–200)
Basophils Relative: 0.6 %
Eosinophils Absolute: 69 cells/uL (ref 15–500)
Eosinophils Relative: 1.3 %
HCT: 37.3 % (ref 35.0–45.0)
Hemoglobin: 12.4 g/dL (ref 11.7–15.5)
Lymphs Abs: 1367 cells/uL (ref 850–3900)
MCH: 28.1 pg (ref 27.0–33.0)
MCHC: 33.2 g/dL (ref 32.0–36.0)
MCV: 84.6 fL (ref 80.0–100.0)
MPV: 9 fL (ref 7.5–12.5)
Monocytes Relative: 6.8 %
Neutro Abs: 3472 cells/uL (ref 1500–7800)
Neutrophils Relative %: 65.5 %
Platelets: 418 10*3/uL — ABNORMAL HIGH (ref 140–400)
RBC: 4.41 10*6/uL (ref 3.80–5.10)
RDW: 14 % (ref 11.0–15.0)
Total Lymphocyte: 25.8 %
WBC mixed population: 360 cells/uL (ref 200–950)
WBC: 5.3 10*3/uL (ref 3.8–10.8)

## 2018-04-17 NOTE — Progress Notes (Signed)
Reviewed lab results with pt. She stated that she is feeling better today and that the Zofran is helping.

## 2018-04-17 NOTE — Progress Notes (Signed)
Please call patient: I have reviewed his/her lab results. Please check to see if she is doing any better. Lab tests are stable.

## 2018-04-18 ENCOUNTER — Other Ambulatory Visit: Payer: Self-pay | Admitting: Family Medicine

## 2018-04-23 ENCOUNTER — Ambulatory Visit: Payer: Self-pay | Admitting: *Deleted

## 2018-04-23 NOTE — Telephone Encounter (Signed)
Spoke with Patient and she has been on the medication about a week, I advised her sometimes the side effects will lessen after 7-10 days on medications. Patient states that she will continue to take medications and see if these side effects taper off. She states she will call back if thngs do not improve over the next week or so.

## 2018-04-23 NOTE — Telephone Encounter (Signed)
Please ask pt how long she has been taking the medication.  When you first start the medication there can be transient side effects that last 7-10 days.  If she has not been taking it that long, I would try to tough it out.  If it's been longer than that and she doesn't like how it makes her feel, she could stop it.  OR if it's an immediate release formulation, she could break it in half and lower the dose to see if that improves things

## 2018-04-23 NOTE — Telephone Encounter (Signed)
Please advise.   Madeline Pho,  LPN   

## 2018-04-23 NOTE — Telephone Encounter (Signed)
Pt is calling to discuss her welbutrin rx she states that when she takes it it makes her feel spaced out -should that be going on   Best number [phone]  Reason for Disposition . Caller has URGENT medication question about med that PCP prescribed and triager unable to answer question    Is having side effect with new medication- wants advisement.  Answer Assessment - Initial Assessment Questions 1. SYMPTOMS: "Do you have any symptoms?"     Mind feels clouded- unable to concentrate. Will be holding a conversation and zone out. ("drunk feeling"- mind wonders) 2. SEVERITY: If symptoms are present, ask "Are they mild, moderate or severe?"     Comes and goes- not all the time  Protocols used: MEDICATION QUESTION CALL-A-AH  Symptoms are present just since started the medication- on it for one week. Patient normally takes her medication in the morning- but has not taken it today. Patient is going to hold the dose- she is aware that her PCP is not in the office this week - but would like advisement on what to do about this medication from one of the other providers.

## 2018-04-24 ENCOUNTER — Ambulatory Visit: Payer: Medicare Other | Admitting: Psychiatry

## 2018-05-03 ENCOUNTER — Ambulatory Visit (INDEPENDENT_AMBULATORY_CARE_PROVIDER_SITE_OTHER): Payer: Medicare Other | Admitting: Psychiatry

## 2018-05-03 DIAGNOSIS — F339 Major depressive disorder, recurrent, unspecified: Secondary | ICD-10-CM

## 2018-05-10 ENCOUNTER — Ambulatory Visit (INDEPENDENT_AMBULATORY_CARE_PROVIDER_SITE_OTHER): Payer: Medicare Other | Admitting: Psychiatry

## 2018-05-10 DIAGNOSIS — F339 Major depressive disorder, recurrent, unspecified: Secondary | ICD-10-CM | POA: Diagnosis not present

## 2018-05-17 ENCOUNTER — Ambulatory Visit: Payer: Medicare Other | Admitting: Family Medicine

## 2018-05-31 ENCOUNTER — Telehealth: Payer: Self-pay | Admitting: Neurology

## 2018-05-31 DIAGNOSIS — R202 Paresthesia of skin: Secondary | ICD-10-CM

## 2018-05-31 NOTE — Telephone Encounter (Signed)
Pt requesting a call stating she she has been noticing intense hand numbness, stating it has been difficult for her to grab/hold things. Please advise.

## 2018-05-31 NOTE — Telephone Encounter (Signed)
Spoke to patient - her hand numbness is a continuation of symptoms she was experiencing when she came in on 02/27/18.  Dr. Krista Blue ordered MRI cervical w/out that showed the following:  Note from Dr. Krista Blue: Please call patient, MRI cervical spine showed mild cervical degenerative changes, no evidence of significant canal or foraminal stenosis.  IMPRESSION: Abnormal MRI scan of cervical spine showing abnormal alignment as well as prominent spondylitic changes from C4-C7 most prominent at C6-7 where there is bilateral foraminal as well as mild canal stenosis.   The patient would like to discuss next step in determining what may be causing her symptoms.

## 2018-06-05 ENCOUNTER — Encounter: Payer: Self-pay | Admitting: Gastroenterology

## 2018-06-05 ENCOUNTER — Ambulatory Visit: Payer: Medicare Other | Admitting: Psychiatry

## 2018-06-07 NOTE — Addendum Note (Signed)
Addended by: Marcial Pacas on: 06/07/2018 08:51 AM   Modules accepted: Orders

## 2018-06-07 NOTE — Telephone Encounter (Signed)
I have spoken with the patient and she is agreeable to proceed with NCV/EMG.  She is aware to expect a call from our office to schedule the appointment.

## 2018-06-07 NOTE — Telephone Encounter (Signed)
Please call patient, I have ordered EMG/NCS for further evaluations.

## 2018-06-12 ENCOUNTER — Ambulatory Visit (INDEPENDENT_AMBULATORY_CARE_PROVIDER_SITE_OTHER): Payer: Medicare Other | Admitting: Psychiatry

## 2018-06-12 DIAGNOSIS — F339 Major depressive disorder, recurrent, unspecified: Secondary | ICD-10-CM | POA: Diagnosis not present

## 2018-06-15 ENCOUNTER — Ambulatory Visit: Payer: Self-pay | Admitting: Gastroenterology

## 2018-06-15 ENCOUNTER — Encounter: Payer: Self-pay | Admitting: Family Medicine

## 2018-06-15 ENCOUNTER — Ambulatory Visit (INDEPENDENT_AMBULATORY_CARE_PROVIDER_SITE_OTHER): Payer: Medicare Other | Admitting: Family Medicine

## 2018-06-15 ENCOUNTER — Ambulatory Visit: Payer: Self-pay | Admitting: Family Medicine

## 2018-06-15 ENCOUNTER — Ambulatory Visit: Payer: Self-pay | Admitting: *Deleted

## 2018-06-15 VITALS — BP 110/58 | HR 84 | Temp 98.3°F | Resp 16 | Ht 65.25 in | Wt 159.6 lb

## 2018-06-15 DIAGNOSIS — G894 Chronic pain syndrome: Secondary | ICD-10-CM

## 2018-06-15 DIAGNOSIS — Z79899 Other long term (current) drug therapy: Secondary | ICD-10-CM | POA: Diagnosis not present

## 2018-06-15 DIAGNOSIS — R413 Other amnesia: Secondary | ICD-10-CM | POA: Diagnosis not present

## 2018-06-15 DIAGNOSIS — F3341 Major depressive disorder, recurrent, in partial remission: Secondary | ICD-10-CM

## 2018-06-15 DIAGNOSIS — R404 Transient alteration of awareness: Secondary | ICD-10-CM | POA: Diagnosis not present

## 2018-06-15 DIAGNOSIS — F1129 Opioid dependence with unspecified opioid-induced disorder: Secondary | ICD-10-CM

## 2018-06-15 NOTE — Progress Notes (Signed)
Subjective  CC:  Chief Complaint  Patient presents with  . Medication Problem    Feeling out of it, losing memory    HPI: Shannon Young is a 63 y.o. female who presents to the office today to address the problems listed above in the chief complaint.  63 year old female with history of chronic alcoholism in remission, polypharmacy with chronic opioid dependence for chronic pain presents due to complaints of worsening memory, episodes of "feeling out of it" and poor balance.  She first had these complaints about 6 months ago.  At that time an MRI revealed chronic vascular changes.  She was referred to neurology and I again referred to that note.  No acute problems were found at that time, Mini-Mental status exam was 30 out of 30, and symptoms were most likely due to polypharmacy and chronic alcohol history.  Patient was here in November and we added Wellbutrin due to worsening depressive symptoms.  She is on Cymbalta for chronic pain and depression.  Over the last several months patient reports that about once weekly she will feel like she is today.  She feels confused at times, will awaken and not have recent memory of what she was doing, poor memory and increased forgetfulness, poor balance.  This started this morning.  She is uncertain why.  She denies fevers, chills, URI symptoms, chest pain, palpitations, shortness of breath, lightheadedness, hemiparesis, head trauma, falls, or urinary symptoms.  Her chronic pain is managed by physicians in [place].  She feels she needs more pain medicine as she is experiencing increases in her lower extremity and back pain symptoms.  She remains free of alcohol. Assessment  1. Transient alteration of awareness   2. Recurrent major depressive disorder, in partial remission (HCC) Chronic  3. Memory impairment   4. Polypharmacy   5. Opioid dependence with opioid-induced disorder ([place])   6. Chronic pain syndrome      Plan   Transient intermittent  alterations of awareness, mental status changes: Current neuro exam is nonfocal outside of mild cerebellar dysfunction with poor balance on heel-to-toe walking, negative Romberg and mildly ataxic rapid alternating movements.  Her mentation appears slightly sedated.  She is alert and oriented x3 at times.  Symptoms still seem most consistent with polypharmacy, chronic opioid use and or cognitive dysfunction due to chronic alcohol use.  Recommend follow-up with neurology, checking lab work for acute problems today, and discussing decreasing opioid use with her pain management doctor.  Discussed possibility of hyper analgesic pain syndrome due to opioids.  Patient is hesitant to make changes.  We did agree to stop the Wellbutrin.  Patient instructed to go to the emergency room for any severe headache, focal neurologic deficits or worsening of mental status.  Follow up: Return if symptoms worsen or fail to improve.  Visit date not found  Orders Placed This Encounter  Procedures  . Ammonia  . B12 and Folate Panel  . CBC with Differential/Platelet  . Comprehensive metabolic panel  . TSH   No orders of the defined types were placed in this encounter.     I reviewed the patients updated PMH, FH, and SocHx.    Patient Active Problem List   Diagnosis Date Noted  . Alcohol dependence in remission ([place]) 10/17/2017    Priority: High  . Insomnia secondary to chronic pain 10/17/2017    Priority: High  . Spinal stenosis of lumbar region 10/17/2017    Priority: High  . Alcoholic hepatitis 76/72/0947  Priority: High  . Essential hypertension 06/29/2015    Priority: High  . Chronic pain syndrome 06/01/2010    Priority: High  . Opioid dependence ([place]) 06/01/2010    Priority: High  . Postlaminectomy syndrome, not elsewhere classified 06/01/2010    Priority: High  . DDD (degenerative disc disease), lumbosacral 03/19/2010    Priority: High  . Family history of ischemic heart disease (IHD)  06/29/2015    Priority: Medium  . GERD (gastroesophageal reflux disease) 05/10/2013    Priority: Medium  . Nonrheumatic mitral valve regurgitation 06/29/2015    Priority: Low  . Recurrent major depressive disorder, in partial remission ([place]) 06/15/2018  . Urinary incontinence 02/27/2018  . Paresthesia 02/27/2018  . Memory loss 11/23/2017  . Depression 10/17/2017  . Delirium tremens ([place]) 10/17/2017  . Orthostatic hypotension 06/29/2015   Current Meds  Medication Sig  . atorvastatin (LIPITOR) 20 MG tablet Take 1 tablet (20 mg total) by mouth at bedtime.  . DULoxetine (CYMBALTA) 60 MG capsule Take 1 capsule (60 mg total) by mouth daily.  . fentaNYL (DURAGESIC - DOSED MCG/HR) 100 MCG/HR Place onto the skin.  . fluticasone (FLONASE) 50 MCG/ACT nasal spray Place 1 spray into both nostrils as needed.   . folic acid (FOLVITE) 1 MG tablet Take by mouth.  . losartan (COZAAR) 100 MG tablet TAKE 1 TABLET BY MOUTH ONCE DAILY  . mirabegron ER (MYRBETRIQ) 25 MG TB24 tablet Take 25 mg by mouth daily.  . Multiple Vitamin (MULTIVITAMIN) capsule Take by mouth.  . naloxone (NARCAN) nasal spray 4 mg/0.1 mL Spray into one nostril for suspected overdose. Repeat x1, if no response after 3 minutes.  Dispense one package with 2 nasal sprays.  Marland Kitchen oxycodone (ROXICODONE) 30 MG immediate release tablet Take 60 mg by mouth 3 (three) times daily.   . pantoprazole (PROTONIX) 40 MG tablet Take 1 tablet (40 mg total) by mouth 2 (two) times daily.  Marland Kitchen thiamine 100 MG tablet Take by mouth.  . vitamin B-12 (CYANOCOBALAMIN) 500 MCG tablet Take 500 mcg by mouth daily.  . [DISCONTINUED] buPROPion (WELLBUTRIN SR) 150 MG 12 hr tablet Take 1 tablet (150 mg total) by mouth 2 (two) times daily.    Allergies: Patient is allergic to gabapentin. Family History: Patient family history includes Alcohol abuse in her brother; Colon cancer in her sister; Healthy in her daughter, daughter, daughter, and son; Hyperlipidemia in her  father; Hypertension in her brother, father, and mother. Social History:  Patient  reports that she has quit smoking. She has never used smokeless tobacco. She reports previous alcohol use. She reports current drug use. Drugs: Fentanyl and Oxycodone.  Review of Systems: Constitutional: Negative for fever malaise or anorexia Cardiovascular: negative for chest pain Respiratory: negative for SOB or persistent cough Gastrointestinal: negative for abdominal pain  Objective  Vitals: BP (!) 110/58   Pulse 84   Temp 98.3 F (36.8 C) (Oral)   Resp 16   Ht 5' 5.25" (1.657 m)   Wt 159 lb 9.6 oz (72.4 kg)   SpO2 97%   BMI 26.36 kg/m  General: no acute distress , A&Ox3 at times but at times will appear sedated. HEENT: PEERL, conjunctiva normal, Oropharynx moist,neck is supple Cardiovascular:  RRR with 2/6 murmur  Respiratory:  Good breath sounds bilaterally, CTAB with normal respiratory effort Skin:  Warm, no rashes Neuro: Cranial nerves II through XII intact, finger-to-nose with slight ataxia, rapid alternating movements slightly slowed, cannot walk heel-to-toe.  Gait is cautious and antalgic but not  wide-based     Commons side effects, risks, benefits, and alternatives for medications and treatment plan prescribed today were discussed, and the patient expressed understanding of the given instructions. Patient is instructed to call or message via MyChart if he/she has any questions or concerns regarding our treatment plan. No barriers to understanding were identified. We discussed Red Flag symptoms and signs in detail. Patient expressed understanding regarding what to do in case of urgent or emergency type symptoms.   Medication list was reconciled, printed and provided to the patient in AVS. Patient instructions and summary information was reviewed with the patient as documented in the AVS. This note was prepared with assistance of Dragon voice recognition software. Occasional wrong-word or  sound-a-like substitutions may have occurred due to the inherent limitations of voice recognition software

## 2018-06-15 NOTE — Patient Instructions (Signed)
We will call you with your lab results.  Please make a follow up appointment with neurology to further assess your mental status changes, balance problems, and memory problems.   Please consider discussing these problems with your pain management doctors; I recommend considering decreasing your opioid medications to help your mentation and possibly your pain a well.   Go to ER for any worsening.   You may stop your wellbutrin.

## 2018-06-15 NOTE — Telephone Encounter (Signed)
Patient called to say that at times she find herself feeling like she is having an out of body experience. States that she feel confused like she is in a fog kind of like she is waking up but not quite awake. She states that this is scaring her and she would like to speak with someone about this. She said that it happened to her today while she was [place] the dog park. She think its being caused by thebuPROP[facility] SR) 150 MG 12 hr tablet  Ph# [phone]  Reason for Disposition . [1] Acting confused (e.g., disoriented, slurred speech) AND [2] brief (now gone)  Answer Assessment - Initial Assessment Questions 1. SYMPTOM: "What is the main symptom you are concerned about?" (e.g., weakness, numbness)     Fading in and out- it happens at different times 2. ONSET: "When did this start?" (minutes, hours, days; while sleeping)     Has happened 3 times- lasts for hours- she seems to drift off 3. LAST NORMAL: "When was the last time you were normal (no symptoms)?"     Yesterday- last night when she went to bed 4. PATTERN "Does this come and go, or has it been constant since it started?"  "Is it present now?"     sporadic comes and goes 5. CARDIAC SYMPTOMS: "Have you had any of the following symptoms: chest pain, difficulty breathing, palpitations?"     no 6. NEUROLOGIC SYMPTOMS: "Have you had any of the following symptoms: headache, dizziness, vision loss, double vision, changes in speech, unsteady on your feet?"     Back is worse, unsteady on feet- does not last long, fatigue 7. OTHER SYMPTOMS: "Do you have any other symptoms?"     Husband has noticed changes 8. PREGNANCY: "Is there any chance you are pregnant?" "When was your last menstrual period?"     n/a  Answer Assessment - Initial Assessment Questions 1. LEVEL OF CONSCIOUSNESS: "How is he (she, the patient) acting right now?" (e.g., alert-oriented, confused, lethargic, stuporous, comatose)     oriented 2. ONSET: "When did the  confusion start?"  (minutes, hours, days)     Comes and goes 3. PATTERN "Does this come and go, or has it been constant since it started?"  "Is it present now?"     Patient states she thinks it comes and goes sporatic 4. ALCOHOL or DRUGS: "Has he been drinking alcohol or taking any drugs?"      Not asked 5. NARCOTIC MEDICATIONS: "Has he been receiving any narcotic medications?" (e.g., morphine, Vicodin)     Patient thinks it is due to Wellbutrin 6. CAUSE: "What do you think is causing the confusion?"      Patient thinks it may be medication 7. OTHER SYMPTOMS: "Are there any other symptoms?" (e.g., difficulty breathing, headache, fever, weakness)     Fatigue, unsteadiness  Protocols used: CONFUSION - DELIRIUM-A-AH, NEUROLOGIC DEFICIT-A-AH

## 2018-06-16 LAB — COMPREHENSIVE METABOLIC PANEL
AG Ratio: 1.8 (calc) (ref 1.0–2.5)
ALT: 13 U/L (ref 6–29)
AST: 18 U/L (ref 10–35)
Albumin: 4.2 g/dL (ref 3.6–5.1)
Alkaline phosphatase (APISO): 71 U/L (ref 33–130)
BUN: 7 mg/dL (ref 7–25)
CO2: 24 mmol/L (ref 20–32)
Calcium: 9.1 mg/dL (ref 8.6–10.4)
Chloride: 96 mmol/L — ABNORMAL LOW (ref 98–110)
Creat: 0.72 mg/dL (ref 0.50–0.99)
Globulin: 2.3 g/dL (calc) (ref 1.9–3.7)
Glucose, Bld: 100 mg/dL — ABNORMAL HIGH (ref 65–99)
Potassium: 4.2 mmol/L (ref 3.5–5.3)
Sodium: 131 mmol/L — ABNORMAL LOW (ref 135–146)
Total Bilirubin: 0.4 mg/dL (ref 0.2–1.2)
Total Protein: 6.5 g/dL (ref 6.1–8.1)

## 2018-06-16 LAB — CBC WITH DIFFERENTIAL/PLATELET
Absolute Monocytes: 383 cells/uL (ref 200–950)
Basophils Absolute: 22 cells/uL (ref 0–200)
Basophils Relative: 0.5 %
Eosinophils Absolute: 151 cells/uL (ref 15–500)
Eosinophils Relative: 3.5 %
HCT: 31.8 % — ABNORMAL LOW (ref 35.0–45.0)
Hemoglobin: 10.8 g/dL — ABNORMAL LOW (ref 11.7–15.5)
Lymphs Abs: 1256 cells/uL (ref 850–3900)
MCH: 28.9 pg (ref 27.0–33.0)
MCHC: 34 g/dL (ref 32.0–36.0)
MCV: 85 fL (ref 80.0–100.0)
MPV: 8.6 fL (ref 7.5–12.5)
Monocytes Relative: 8.9 %
Neutro Abs: 2490 cells/uL (ref 1500–7800)
Neutrophils Relative %: 57.9 %
Platelets: 337 10*3/uL (ref 140–400)
RBC: 3.74 10*6/uL — ABNORMAL LOW (ref 3.80–5.10)
RDW: 13 % (ref 11.0–15.0)
Total Lymphocyte: 29.2 %
WBC: 4.3 10*3/uL (ref 3.8–10.8)

## 2018-06-16 LAB — AMMONIA: Ammonia: 43 umol/L (ref <=72)

## 2018-06-16 LAB — B12 AND FOLATE PANEL
Folate: >24 ng/mL
Vitamin B-12: 1102 pg/mL — ABNORMAL HIGH (ref 200–1100)

## 2018-06-16 LAB — TSH: TSH: 1.08 mIU/L (ref 0.40–4.50)

## 2018-06-18 ENCOUNTER — Encounter: Payer: Self-pay | Admitting: *Deleted

## 2018-06-18 ENCOUNTER — Other Ambulatory Visit: Payer: Self-pay | Admitting: *Deleted

## 2018-06-18 DIAGNOSIS — D649 Anemia, unspecified: Secondary | ICD-10-CM

## 2018-06-18 NOTE — Progress Notes (Signed)
Please call patient: I have reviewed his/her lab results. Blood work looks mostly good; however her sodium is a little bit low as is her hemoglobin. Please drink gatorade once daily and eat a regular sodium diet and return next week for repeat BMP.  The anemia is recurrent; this could be due to volume status being high (causing the sodium problem as well) or could be due to other issues. We will recheck the CBC and iron studies in one week as well.  If it remains, she will need colonoscopy.  Please schedule lab visit.  No other causes for mental status changes are identified. Vit B12 and folate levels are now high normal.

## 2018-06-19 ENCOUNTER — Ambulatory Visit (INDEPENDENT_AMBULATORY_CARE_PROVIDER_SITE_OTHER): Payer: Medicare Other | Admitting: Psychiatry

## 2018-06-19 DIAGNOSIS — F339 Major depressive disorder, recurrent, unspecified: Secondary | ICD-10-CM

## 2018-06-25 ENCOUNTER — Other Ambulatory Visit (INDEPENDENT_AMBULATORY_CARE_PROVIDER_SITE_OTHER): Payer: Medicare Other

## 2018-06-25 DIAGNOSIS — D649 Anemia, unspecified: Secondary | ICD-10-CM

## 2018-06-25 LAB — BASIC METABOLIC PANEL
BUN: 12 mg/dL (ref 6–23)
CO2: 26 mEq/L (ref 19–32)
Calcium: 9.3 mg/dL (ref 8.4–10.5)
Chloride: 92 mEq/L — ABNORMAL LOW (ref 96–112)
Creatinine, Ser: 0.74 mg/dL (ref 0.40–1.20)
GFR: 79.84 mL/min (ref >60.00)
Glucose, Bld: 96 mg/dL (ref 70–99)
Potassium: 4.1 mEq/L (ref 3.5–5.1)
Sodium: 127 mEq/L — ABNORMAL LOW (ref 135–145)

## 2018-06-25 LAB — CBC WITH DIFFERENTIAL/PLATELET
Basophils Absolute: 0 10*3/uL (ref 0.0–0.1)
Basophils Relative: 0.5 % (ref 0.0–3.0)
Eosinophils Absolute: 0.1 10*3/uL (ref 0.0–0.7)
Eosinophils Relative: 2.9 % (ref 0.0–5.0)
HCT: 35.8 % — ABNORMAL LOW (ref 36.0–46.0)
Hemoglobin: 12.4 g/dL (ref 12.0–15.0)
Lymphocytes Relative: 38.5 % (ref 12.0–46.0)
Lymphs Abs: 2 10*3/uL (ref 0.7–4.0)
MCHC: 34.5 g/dL (ref 30.0–36.0)
MCV: 84.2 fl (ref 78.0–100.0)
Monocytes Absolute: 0.4 10*3/uL (ref 0.1–1.0)
Monocytes Relative: 7 % (ref 3.0–12.0)
Neutro Abs: 2.6 10*3/uL (ref 1.4–7.7)
Neutrophils Relative %: 51.1 % (ref 43.0–77.0)
Platelets: 409 10*3/uL — ABNORMAL HIGH (ref 150.0–400.0)
RBC: 4.25 Mil/uL (ref 3.87–5.11)
RDW: 14.2 % (ref 11.5–15.5)
WBC: 5.1 10*3/uL (ref 4.0–10.5)

## 2018-06-25 NOTE — Progress Notes (Signed)
Please call patient: I have reviewed his/her lab results. Sodium levels are even a bit lower and need further evaluation. Pt needs OV tomorrow if possible to check exam, blood and urine tests to determine cause.

## 2018-06-26 ENCOUNTER — Ambulatory Visit (INDEPENDENT_AMBULATORY_CARE_PROVIDER_SITE_OTHER): Payer: Medicare Other | Admitting: Psychiatry

## 2018-06-26 ENCOUNTER — Ambulatory Visit (INDEPENDENT_AMBULATORY_CARE_PROVIDER_SITE_OTHER): Payer: Medicare Other | Admitting: Family Medicine

## 2018-06-26 ENCOUNTER — Other Ambulatory Visit: Payer: Self-pay

## 2018-06-26 ENCOUNTER — Encounter: Payer: Self-pay | Admitting: Family Medicine

## 2018-06-26 ENCOUNTER — Other Ambulatory Visit: Payer: Self-pay | Admitting: Family Medicine

## 2018-06-26 VITALS — BP 134/78 | HR 79 | Temp 98.4°F | Resp 16 | Ht 65.25 in | Wt 159.0 lb

## 2018-06-26 DIAGNOSIS — E611 Iron deficiency: Secondary | ICD-10-CM | POA: Diagnosis not present

## 2018-06-26 DIAGNOSIS — E871 Hypo-osmolality and hyponatremia: Secondary | ICD-10-CM | POA: Diagnosis not present

## 2018-06-26 DIAGNOSIS — F3341 Major depressive disorder, recurrent, in partial remission: Secondary | ICD-10-CM | POA: Diagnosis not present

## 2018-06-26 DIAGNOSIS — G894 Chronic pain syndrome: Secondary | ICD-10-CM | POA: Diagnosis not present

## 2018-06-26 DIAGNOSIS — F339 Major depressive disorder, recurrent, unspecified: Secondary | ICD-10-CM

## 2018-06-26 LAB — IRON,TIBC AND FERRITIN PANEL
%SAT: 12 % (calc) — ABNORMAL LOW (ref 16–45)
Ferritin: 29 ng/mL (ref 16–232)
Iron: 45 ug/dL (ref 45–160)
TIBC: 371 mcg/dL (calc) (ref 250–450)

## 2018-06-26 NOTE — Progress Notes (Signed)
Labs reviewed. Anemia is improved: ? Due to dehydration or volume status changes. Borderline low iron levels. Will discuss at today's visit. Start once daily iron and monitor. Will assess volume status with recent worsening hyponatremia.

## 2018-06-26 NOTE — Patient Instructions (Signed)
Please return in 1-2 weeks for recheck.  Please hold losartan, limit fluid to 800-1033ml per day, increase salt in diet with foods: soups, canned veggies, deli meat etc.   Go to ER for any mental status changes.   If you have any questions or concerns, please don't hesitate to send me a message via MyChart or call the office at [phone]. Thank you for visiting with [place] today! It's our pleasure caring for you.

## 2018-06-26 NOTE — Progress Notes (Signed)
Subjective  CC:  Chief Complaint  Patient presents with  . Follow-up    Discuss low sodium level  . Depression    Bupropione was discontinued, wants to discuss another medication or restarting    HPI: Shannon Young is a 63 y.o. female who presents to the office today to address the problems listed above in the chief complaint.  Worsening hyponatremia: here for further evaluation  Consistent over the last 2 weeks. Volume status: euvolemic vs slight hypovolemic. Diet: drinks 8-12 8oz diet pepsi's day, increase voids, eating a lot of sweets and little food. No alcohol. No h/o chf or cirrhosis. No diarrhea or vomiting.   Iron deficiency: borderline. hgb was variable nl to anemic: likely related to hydration status as well.   Mental status changes : reports normalized since stopping wellbutrin (she had accidentally restarted it).   However depression and anxiety remain active; on cymbalta w/o improvement. Would like to try xanax prn.    Wt Readings from Last 3 Encounters:  06/26/18 159 lb (72.1 kg)  06/15/18 159 lb 9.6 oz (72.4 kg)  04/16/18 152 lb (68.9 kg)    Assessment  1. Hyponatremia   2. Recurrent major depressive disorder, in partial remission ([place])   3. Chronic pain syndrome   4. Iron deficiency      Plan   Hyponatremia, moderate w/o clear symptoms:  Education given. Check urine sodium, chloride and osmolality to distinguish cause. Clinically hypotonic hypovolemic or euvolemic. Poor nutritional intake and high soda intake. Also on losartan.   rec holding losartan, fluid restrict to 800-1076ml / day, increase sodium intake through food intake and recheck 1-2 weeks. Discussed need for emergent evaluation for mental status changes.    Depression: active. Will need to wean off cymbalta and can consider zoloft or trintellix (cost may be a problem). Decline xanax use in setting of hyponatremia and given h/o alcoholism and chronic opioids. Consider hydroxyzine or psych.    Iron deficiency: likely nutritional. May start iron otc daily once hyponatremia eval completed.   Follow up: 1-2 weeks.    Orders Placed This Encounter  Procedures  . Chloride, urine, random   No orders of the defined types were placed in this encounter.     I reviewed the patients updated PMH, FH, and SocHx.    Patient Active Problem List   Diagnosis Date Noted  . Alcohol dependence in remission ([place]) 10/17/2017    Priority: High  . Insomnia secondary to chronic pain 10/17/2017    Priority: High  . Spinal stenosis of lumbar region 10/17/2017    Priority: High  . Alcoholic hepatitis 62/94/7654    Priority: High  . Essential hypertension 06/29/2015    Priority: High  . Chronic pain syndrome 06/01/2010    Priority: High  . Opioid dependence ([place]) 06/01/2010    Priority: High  . Postlaminectomy syndrome, not elsewhere classified 06/01/2010    Priority: High  . DDD (degenerative disc disease), lumbosacral 03/19/2010    Priority: High  . Family history of ischemic heart disease (IHD) 06/29/2015    Priority: Medium  . GERD (gastroesophageal reflux disease) 05/10/2013    Priority: Medium  . Nonrheumatic mitral valve regurgitation 06/29/2015    Priority: Low  . Recurrent major depressive disorder, in partial remission ([place]) 06/15/2018  . Urinary incontinence 02/27/2018  . Paresthesia 02/27/2018  . Memory loss 11/23/2017  . Depression 10/17/2017  . Delirium tremens ([place]) 10/17/2017  . Orthostatic hypotension 06/29/2015   Current Meds  Medication Sig  .  atorvastatin (LIPITOR) 20 MG tablet Take 1 tablet (20 mg total) by mouth at bedtime.  . DULoxetine (CYMBALTA) 60 MG capsule Take 1 capsule (60 mg total) by mouth daily.  . fentaNYL (DURAGESIC - DOSED MCG/HR) 100 MCG/HR Place onto the skin.  . fluticasone (FLONASE) 50 MCG/ACT nasal spray Place 1 spray into both nostrils as needed.   . folic acid (FOLVITE) 1 MG tablet Take by mouth.  . losartan (COZAAR) 100 MG tablet  TAKE 1 TABLET BY MOUTH ONCE DAILY  . mirabegron ER (MYRBETRIQ) 25 MG TB24 tablet Take 25 mg by mouth daily.  . Multiple Vitamin (MULTIVITAMIN) capsule Take by mouth.  . naloxone (NARCAN) nasal spray 4 mg/0.1 mL Spray into one nostril for suspected overdose. Repeat x1, if no response after 3 minutes.  Dispense one package with 2 nasal sprays.  . ondansetron (ZOFRAN ODT) 4 MG disintegrating tablet Take 1 tablet (4 mg total) by mouth every 8 (eight) hours as needed for nausea or vomiting.  Marland Kitchen oxycodone (ROXICODONE) 30 MG immediate release tablet Take 60 mg by mouth 3 (three) times daily.   . pantoprazole (PROTONIX) 40 MG tablet Take 1 tablet (40 mg total) by mouth 2 (two) times daily.  Marland Kitchen thiamine 100 MG tablet Take by mouth.  . vitamin B-12 (CYANOCOBALAMIN) 500 MCG tablet Take 500 mcg by mouth daily.    Allergies: Patient is allergic to gabapentin. Family History: Patient family history includes Alcohol abuse in her brother; Colon cancer in her sister; Healthy in her daughter, daughter, daughter, and son; Hyperlipidemia in her father; Hypertension in her brother, father, and mother. Social History:  Patient  reports that she has quit smoking. She has never used smokeless tobacco. She reports previous alcohol use. She reports current drug use. Drugs: Fentanyl and Oxycodone.  Review of Systems: Constitutional: Negative for fever malaise or anorexia Cardiovascular: negative for chest pain Respiratory: negative for SOB or persistent cough Gastrointestinal: negative for abdominal pain  Objective  Vitals: BP 134/78   Pulse 79   Temp 98.4 F (36.9 C) (Oral)   Resp 16   Ht 5' 5.25" (1.657 m)   Wt 159 lb (72.1 kg)   SpO2 98%   BMI 26.26 kg/m  General: no acute distress , A&Ox3, clear mental status today HEENT: PEERL, conjunctiva normal, Oropharynx moist,neck is supple, no jvd Cardiovascular:  RRR without murmur or gallop.  Respiratory:  Good breath sounds bilaterally, CTAB with normal  respiratory effort, no rales Gastrointestinal: soft, flat abdomen, normal active bowel sounds, no palpable masses, no hepatosplenomegaly, no appreciated hernias Ext: no edema Skin:  Warm, no rashes  No visits with results within 1 Day(s) from this visit.  Latest known visit with results is:  Lab on 06/25/2018  Component Date Value Ref Range Status  . Iron 06/25/2018 45  45 - 160 mcg/dL Final  . TIBC 06/25/2018 371  250 - 450 mcg/dL (calc) Final  . %SAT 06/25/2018 12* 16 - 45 % (calc) Final  . Ferritin 06/25/2018 29  16 - 232 ng/mL Final  . WBC 06/25/2018 5.1  4.0 - 10.5 K/uL Final  . RBC 06/25/2018 4.25  3.87 - 5.11 Mil/uL Final  . Hemoglobin 06/25/2018 12.4  12.0 - 15.0 g/dL Final  . HCT 06/25/2018 35.8* 36.0 - 46.0 % Final  . MCV 06/25/2018 84.2  78.0 - 100.0 fl Final  . MCHC 06/25/2018 34.5  30.0 - 36.0 g/dL Final  . RDW 06/25/2018 14.2  11.5 - 15.5 % Final  . Platelets 06/25/2018  409.0* 150.0 - 400.0 K/uL Final  . Neutrophils Relative % 06/25/2018 51.1  43.0 - 77.0 % Final  . Lymphocytes Relative 06/25/2018 38.5  12.0 - 46.0 % Final  . Monocytes Relative 06/25/2018 7.0  3.0 - 12.0 % Final  . Eosinophils Relative 06/25/2018 2.9  0.0 - 5.0 % Final  . Basophils Relative 06/25/2018 0.5  0.0 - 3.0 % Final  . Neutro Abs 06/25/2018 2.6  1.4 - 7.7 K/uL Final  . Lymphs Abs 06/25/2018 2.0  0.7 - 4.0 K/uL Final  . Monocytes Absolute 06/25/2018 0.4  0.1 - 1.0 K/uL Final  . Eosinophils Absolute 06/25/2018 0.1  0.0 - 0.7 K/uL Final  . Basophils Absolute 06/25/2018 0.0  0.0 - 0.1 K/uL Final  . Sodium 06/25/2018 127* 135 - 145 mEq/L Final  . Potassium 06/25/2018 4.1  3.5 - 5.1 mEq/L Final  . Chloride 06/25/2018 92* 96 - 112 mEq/L Final  . CO2 06/25/2018 26  19 - 32 mEq/L Final  . Glucose, Bld 06/25/2018 96  70 - 99 mg/dL Final  . BUN 06/25/2018 12  6 - 23 mg/dL Final  . Creatinine, Ser 06/25/2018 0.74  0.40 - 1.20 mg/dL Final  . Calcium 06/25/2018 9.3  8.4 - 10.5 mg/dL Final  . GFR  06/25/2018 79.84  >60.00 mL/min Final       Commons side effects, risks, benefits, and alternatives for medications and treatment plan prescribed today were discussed, and the patient expressed understanding of the given instructions. Patient is instructed to call or message via MyChart if he/she has any questions or concerns regarding our treatment plan. No barriers to understanding were identified. We discussed Red Flag symptoms and signs in detail. Patient expressed understanding regarding what to do in case of urgent or emergency type symptoms.   Medication list was reconciled, printed and provided to the patient in AVS. Patient instructions and summary information was reviewed with the patient as documented in the AVS. This note was prepared with assistance of Dragon voice recognition software. Occasional wrong-word or sound-a-like substitutions may have occurred due to the inherent limitations of voice recognition software

## 2018-06-28 LAB — CHLORIDE, URINE, RANDOM: Chloride Urine: 32 mmol/L (ref 32–290)

## 2018-06-28 LAB — SODIUM, URINE, RANDOM: Sodium, Ur: 41 mmol/L (ref 28–272)

## 2018-06-28 LAB — OSMOLALITY, URINE: Osmolality, Ur: 392 mOsm/kg (ref 50–1200)

## 2018-06-28 LAB — EXTRA URINE SPECIMEN

## 2018-06-28 NOTE — Addendum Note (Signed)
Addended by: Billey Chang on: 06/28/2018 01:05 PM   Modules accepted: Orders

## 2018-06-28 NOTE — Progress Notes (Signed)
Please call patient: I have reviewed his/her lab results. Please have patient continue high salt diet with fluid restriction over weekend and return on Monday am for repeat urine and blood work. If remains abnormal, will refer to endocrinology for further evaluation. If improved, will continue course. Thanks.  I've ordered future labs.

## 2018-07-02 ENCOUNTER — Other Ambulatory Visit (INDEPENDENT_AMBULATORY_CARE_PROVIDER_SITE_OTHER): Payer: Medicare Other

## 2018-07-02 DIAGNOSIS — E871 Hypo-osmolality and hyponatremia: Secondary | ICD-10-CM | POA: Diagnosis not present

## 2018-07-02 DIAGNOSIS — E611 Iron deficiency: Secondary | ICD-10-CM | POA: Diagnosis not present

## 2018-07-02 LAB — CBC WITH DIFFERENTIAL/PLATELET
Basophils Absolute: 0 10*3/uL (ref 0.0–0.1)
Basophils Relative: 0.8 % (ref 0.0–3.0)
Eosinophils Absolute: 0.2 10*3/uL (ref 0.0–0.7)
Eosinophils Relative: 2.7 % (ref 0.0–5.0)
HCT: 36.2 % (ref 36.0–46.0)
Hemoglobin: 12.2 g/dL (ref 12.0–15.0)
Lymphocytes Relative: 19.3 % (ref 12.0–46.0)
Lymphs Abs: 1.2 10*3/uL (ref 0.7–4.0)
MCHC: 33.7 g/dL (ref 30.0–36.0)
MCV: 86 fl (ref 78.0–100.0)
Monocytes Absolute: 0.3 10*3/uL (ref 0.1–1.0)
Monocytes Relative: 4.4 % (ref 3.0–12.0)
Neutro Abs: 4.4 10*3/uL (ref 1.4–7.7)
Neutrophils Relative %: 72.8 % (ref 43.0–77.0)
Platelets: 476 10*3/uL — ABNORMAL HIGH (ref 150.0–400.0)
RBC: 4.21 Mil/uL (ref 3.87–5.11)
RDW: 14.2 % (ref 11.5–15.5)
WBC: 6.1 10*3/uL (ref 4.0–10.5)

## 2018-07-02 LAB — BASIC METABOLIC PANEL
BUN: 15 mg/dL (ref 6–23)
CO2: 24 mEq/L (ref 19–32)
Calcium: 9.2 mg/dL (ref 8.4–10.5)
Chloride: 99 mEq/L (ref 96–112)
Creatinine, Ser: 0.74 mg/dL (ref 0.40–1.20)
GFR: 79.83 mL/min (ref >60.00)
Glucose, Bld: 115 mg/dL — ABNORMAL HIGH (ref 70–99)
Potassium: 4.5 mEq/L (ref 3.5–5.1)
Sodium: 131 mEq/L — ABNORMAL LOW (ref 135–145)

## 2018-07-02 LAB — TSH: TSH: 0.63 u[IU]/mL (ref 0.35–4.50)

## 2018-07-03 ENCOUNTER — Other Ambulatory Visit: Payer: Self-pay | Admitting: *Deleted

## 2018-07-03 DIAGNOSIS — E871 Hypo-osmolality and hyponatremia: Secondary | ICD-10-CM

## 2018-07-03 DIAGNOSIS — I10 Essential (primary) hypertension: Secondary | ICD-10-CM

## 2018-07-03 LAB — OSMOLALITY, URINE

## 2018-07-03 LAB — SODIUM, URINE, RANDOM: Sodium, Ur: 72 mmol/L (ref 28–272)

## 2018-07-03 LAB — CHLORIDE, URINE, RANDOM: Chloride Urine: 86 mmol/L (ref 32–290)

## 2018-07-03 NOTE — Progress Notes (Signed)
Please call patient: I have reviewed his/her lab results. Sodium levels are improving with high salt diet and fluid restriction. Please continue this. Recheck 4 weeks. To endocrine for further eval if hasn't normalized then.  And Katie, urine osmolality was not done .Marland Kitchen.. can you clarify why for me? Thank you.   (SIADH vs other)

## 2018-07-04 DIAGNOSIS — G894 Chronic pain syndrome: Secondary | ICD-10-CM | POA: Diagnosis not present

## 2018-07-04 DIAGNOSIS — M5442 Lumbago with sciatica, left side: Secondary | ICD-10-CM | POA: Diagnosis not present

## 2018-07-04 DIAGNOSIS — M5440 Lumbago with sciatica, unspecified side: Secondary | ICD-10-CM | POA: Diagnosis not present

## 2018-07-04 DIAGNOSIS — M5441 Lumbago with sciatica, right side: Secondary | ICD-10-CM | POA: Diagnosis not present

## 2018-07-05 LAB — OSMOLALITY, URINE: Osmolality, Ur: 336 mOsm/kg (ref 50–1200)

## 2018-07-06 ENCOUNTER — Encounter: Payer: Medicare Other | Admitting: Neurology

## 2018-07-09 ENCOUNTER — Encounter: Payer: Self-pay | Admitting: Neurology

## 2018-07-16 ENCOUNTER — Ambulatory Visit (INDEPENDENT_AMBULATORY_CARE_PROVIDER_SITE_OTHER): Payer: Medicare Other | Admitting: Gastroenterology

## 2018-07-16 ENCOUNTER — Encounter (INDEPENDENT_AMBULATORY_CARE_PROVIDER_SITE_OTHER): Payer: Self-pay

## 2018-07-16 ENCOUNTER — Encounter: Payer: Self-pay | Admitting: Gastroenterology

## 2018-07-16 VITALS — BP 138/62 | HR 87 | Ht 65.0 in

## 2018-07-16 DIAGNOSIS — R159 Full incontinence of feces: Secondary | ICD-10-CM | POA: Diagnosis not present

## 2018-07-16 DIAGNOSIS — K219 Gastro-esophageal reflux disease without esophagitis: Secondary | ICD-10-CM

## 2018-07-16 MED ORDER — NA SULFATE-K SULFATE-MG SULF 17.5-3.13-1.6 GM/177ML PO SOLN: 1.0000 | ORAL | 0 refills | Status: DC

## 2018-07-16 NOTE — Patient Instructions (Addendum)
Avoid sugars and caffeine Keep a food and symptom diary to identify causitive factors Keep your bottom clean and dry without excessive wiping or using astringent cleaners Apply a barrier cream such as zinc oxide to the perianal skin Continue with incontinence pads to protect your skin and clothes from fecal soiling Add Citrucel daily Loperamide should be used to avoid diarrhea. Take 4 mg daily. Take an additional 2 mg after each loose stool over the course of the day (not to exceed 16 mg daily)  You have been scheduled for a colonoscopy. Please follow written instructions given to you at your visit today.  Please pick up your prep supplies at the pharmacy within the next 1-3 days. If you use inhalers (even only as needed), please bring them with you on the day of your procedure. Your physician has requested that you go to www.[email or web address] and enter the access code given to you at your visit today. This web site gives a general overview about your procedure. However, you should still follow specific instructions given to you by our office regarding your preparation for the procedure.

## 2018-07-16 NOTE — Progress Notes (Signed)
Referring Provider: Leamon Arnt, MD Primary Care Physician:  Leamon Arnt, MD   Reason for Consultation:  Fecal incontinence   IMPRESSION:  Fecal incontinence GERD controlled on pantoprazole    -Esophageal biopsies negative for eosinophilic esophagitis 4235 3 cm hiatal hernia on EGD 2015 at [facility] of unexplained nausea and vomiting thought to be related to narcotic use for back pain    - evaluated by GI in [place], [place]    -Normal gastric and duodenal biopsies 2015    - symptoms controlled with Zofran Family history of colon cancer (sister at age 72) Family history of ulcerative colitis History of heavy alcohol use Colonoscopy 2015 incomplete assessment of the right colon    -Right colon appeared congested, random colon biopsies were normal    - repeat exam recommended in 3 years  Causes of fecal incontinence include anal sphincter weakness, decreased rectal sensation, decreased rectal compliance, overflow, and idiopathic fecal incontinence.  Must exclude a concurrent colitis, in particular microscopic colitis.  PLAN: Colonoscopy with random biopsies to evaluate the diarrhea Psyllium to add stool bulk Loperamide to treat the diarrhea. If loperamide is not working, will add a bile acid binder. Will proceed with anorectal manometry +/- endoscopic ultrasound +/- pelvic floor MRI if colonoscopy is nondiagnostic  Recommendations for the patient: Avoid sugars and caffeine (drinking 8-10 drinks daily - carbonated water recommended) Keep a food and symptom diary to identify causitive factors Keep your bottom clean and dry without excessive wiping or using astringent cleaners Apply a barrier cream such as zinc oxide to the perianal skin Consider using incontinence pads to protect your skin and clothes from fecal soiling  I consented the patient at the bedside today discussing the risks, benefits, and alternatives to endoscopic evaluation. In particular, we  discussed the risks that include, but are not limited to, reaction to medication, cardiopulmonary compromise, bleeding requiring blood transfusion, aspiration resulting in pneumonia, perforation requiring surgery, lack of diagnosis, severe illness requiring hospitalization, and even death. We reviewed the risk of missed lesion including polyps or even cancer. The patient acknowledges these risks and asks that we proceed.   HPI: Shannon Young is a 63 y.o. female seen in consultation at the request of Dr. Jonni Sanger for further evaluation of fecal incontinence.  The history is obtained through the patient and review of her electronic medical record.  I have also reviewed CareEverywhere in Epic to find GI records from 2015 at a [facility] in [place], [place]. she has chronic cervical axial pain with stenosis at 6 C6-7 with mild left foraminal narrowing.  She has midline low back pain without sciatica.  Several years of incontinence with bouts where it is worse. Seems to be occurring more frequently and consistently.   Patient is intermittently unable to control her stool with defecation.  There is a lack of awareness of the need to defecate when this happens. Fecal leaking during sex. Frequent soiling accidents. Some nocturnal symptoms. Size of BM fluctuates between small stain, moderate amount but not a full bowel movement or a full bowel movement. Solid, liquid, and gas. Occassional fecal urgency when the consistency is more solid. When more liquid or gas it is hard to control.     No identified precipitating events (in the setting of diarrhea, medication use). Not associated with Fentanyl.  No recent change in stool consistency.  Decreased rectal sensation.   No history of prior hemorrhoid, fissure or anorectal surgery, pelvic irradiation, or neurologic disease.  No perineal pain. No history of diabetes.  No protrusion of tissue from the anal canal.   Some urinary incontinence.  Previously seen by [facility] and a C-spine MRI showed no spinal cord impingement.  Seen by [facility]. He suggested there might be some cross-over help with the treatment that he prescribed.   She is concerned that this may be related to her multiple back surgeries. Also having intermittent hand paresthesias. Significant stress and she wonders if this might be related. Four kids and multiple stressors including her 68 year old daughter who has not been making good decisions.    Four vaginal deliveries with 3 pregnancies. No prolonged labor, use of forceps, or perineal laceration. Twins were 6 15 and 6 8. No high birthweight infant.  Occipitoposterior presentation during delivery.  Colonoscopy in [place], [place] through [facility] showed a normal colonoscopy 02/13/2014.  However the gastroenterologist noted that air insufflation was difficult in the proximal colon limiting complete visualization and a repeat exam was recommended in 3 years.  Random colon biopsies were obtained because the folds appeared possibly edematous.  She was also evaluated for unexplained nausea and vomiting thought to be related to narcotic use for back pain. She had an EGD every one -two years through [facility] to exclude incident development of ulcers or gastritis. Symptoms controlled with Zofran. She was also followed for GERD.   Married for 37 years. History of heavy alcohol - wine. In the past few years the drinking has gotten much worse. Frequently relocating. 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 Quit drinking alcohol after her recent hospitalization.   Past Medical History:  Diagnosis Date  . Alcoholic hepatitis 6/97/9480  . Delirium tremens ([place]) 10/17/2017   Acute alcohol and benzo w/d: 08/2017  . Depression   . GERD (gastroesophageal reflux disease)   . Heart murmur   . Hyperlipidemia   . Hypertension   . Osteopenia   . Vision abnormalities     Past Surgical History:  Procedure Laterality  Date  . BACK SURGERY  2008,2017,2019   x 3  . BUNIONECTOMY  2003  . MANDIBLE FRACTURE SURGERY  2005  . TOTAL SHOULDER REPLACEMENT  2015    Current Outpatient Medications  Medication Sig Dispense Refill  . atorvastatin (LIPITOR) 20 MG tablet Take 1 tablet (20 mg total) by mouth at bedtime. 90 tablet 3  . DULoxetine (CYMBALTA) 60 MG capsule Take 1 capsule (60 mg total) by mouth daily. 90 capsule 3  . fentaNYL (DURAGESIC - DOSED MCG/HR) 100 MCG/HR Place onto the skin. Pt says she uses the 25 MCG PATC    . fluticasone (FLONASE) 50 MCG/ACT nasal spray Place 1 spray into both nostrils as needed.     . folic acid (FOLVITE) 1 MG tablet Take by mouth.    . losartan (COZAAR) 100 MG tablet TAKE 1 TABLET BY MOUTH ONCE DAILY 90 tablet 1  . mirabegron ER (MYRBETRIQ) 25 MG TB24 tablet Take 25 mg by mouth daily.    . Multiple Vitamin (MULTIVITAMIN) capsule Take by mouth daily.     . naloxone (NARCAN) nasal spray 4 mg/0.1 mL Spray into one nostril for suspected overdose. Repeat x1, if no response after 3 minutes.  Dispense one package with 2 nasal sprays.    . ondansetron (ZOFRAN ODT) 4 MG disintegrating tablet Take 1 tablet (4 mg total) by mouth every 8 (eight) hours as needed for nausea or vomiting. 20 tablet 0  . oxycodone (ROXICODONE) 30  MG immediate release tablet Take 60 mg by mouth 4 (four) times daily.     . pantoprazole (PROTONIX) 40 MG tablet Take 1 tablet (40 mg total) by mouth 2 (two) times daily. 180 tablet 3  . thiamine 100 MG tablet Take by mouth.    . vitamin B-12 (CYANOCOBALAMIN) 500 MCG tablet Take 500 mcg by mouth daily.     No current facility-administered medications for this visit.     Allergies as of 07/16/2018 - Review Complete 06/26/2018  Allergen Reaction Noted  . Bupropion  07/16/2018  . Gabapentin Other (See Comments) 06/29/2015    Family History  Problem Relation Age of Onset  . Hypertension Mother   . Hypertension Father   . Hyperlipidemia Father   . Colon cancer  Sister        ? or stomach  . Alcohol abuse Brother   . Hypertension Brother   . Healthy Daughter   . Healthy Son   . Healthy Daughter   . Healthy Daughter     Social History   Socioeconomic History  . Marital status: Married    Spouse name: Not on file  . Number of children: Not on file  . Years of education: Not on file  . Highest education level: Not on file  Occupational History  . Not on file  Social Needs  . Financial resource strain: Not on file  . Food insecurity:    Worry: Not on file    Inability: Not on file  . Transportation needs:    Medical: Not on file    Non-medical: Not on file  Tobacco Use  . Smoking status: Former Research scientist (life sciences)  . Smokeless tobacco: Never Used  Substance and Sexual Activity  . Alcohol use: Not Currently  . Drug use: Yes    Types: Fentanyl, Oxycodone  . Sexual activity: Yes  Lifestyle  . Physical activity:    Days per week: Not on file    Minutes per session: Not on file  . Stress: Not on file  Relationships  . Social connections:    Talks on phone: Not on file    Gets together: Not on file    Attends religious service: Not on file    Active member of club or organization: Not on file    Attends meetings of clubs or organizations: Not on file    Relationship status: Not on file  . Intimate partner violence:    Fear of current or ex partner: Not on file    Emotionally abused: Not on file    Physically abused: Not on file    Forced sexual activity: Not on file  Other Topics Concern  . Not on file  Social History Narrative  . Not on file    Review of Systems: 12 system ROS is negative except as noted above.  There were no vitals filed for this visit.  Physical Exam: Vital signs were reviewed. General:   Alert, well-nourished, pleasant and cooperative in NAD Head:  Normocephalic and atraumatic. Eyes:  Sclera clear, no icterus.   Conjunctiva pink. Mouth:  No deformity or lesions.   Neck:  Supple; no thyromegaly. Lungs:   Clear throughout to auscultation.   No wheezes.  Heart:  Regular rate and rhythm; no murmurs Abdomen:  Soft, nontender, normal bowel sounds. No rebound or guarding. No hepatosplenomegaly Rectal:  Deferred  Msk:  Symmetrical without gross deformities. Extremities:  No gross deformities or edema. Neurologic:  Alert and  oriented x4;  grossly  nonfocal Skin:  No rash or bruise. Psych:  Alert and cooperative. Normal mood and affect.   Lord Lancour L. Tarri Glenn, MD, MPH [facility] 07/16/2018, 3:48 PM

## 2018-07-17 ENCOUNTER — Telehealth: Payer: Self-pay

## 2018-07-17 NOTE — Telephone Encounter (Signed)
Urine test is stable. Sodium had improved as noted. She will be due for recheck in 2 weeks.  07/31/2018

## 2018-07-17 NOTE — Telephone Encounter (Signed)
Pt is aware, and will callback once she has found a neurologist that she would like to see.

## 2018-07-17 NOTE — Telephone Encounter (Signed)
Copied from [place] [id number]. Topic: General - Inquiry >> Jul 17, 2018  8:58 AM Shannon Young wrote: Reason for CRM:   Pt calling to see if urinalysis results are back.  Per NT there is a Urine Osmolality test but it hasn't been signed off on by Dr. Jonni Sanger - unable to give results. Pt can be reached at her home number

## 2018-07-17 NOTE — Telephone Encounter (Signed)
Copied from [place] [id number]. Topic: Referral - Request for Referral >> Jul 17, 2018  8:52 AM Reyne Dumas L wrote: Has patient seen PCP for this complaint? yes *If NO, is insurance requiring patient see PCP for this issue before PCP can refer them? Referral for which specialty: neurology Preferred provider/office: not Dr. Krista Blue - doesn't like his bedside manner.  Would like to go to someone at [facility] for referral: needs new neurologist.  Pt can be reached at [phone].

## 2018-07-17 NOTE — Telephone Encounter (Signed)
Please advise 

## 2018-07-18 ENCOUNTER — Ambulatory Visit (INDEPENDENT_AMBULATORY_CARE_PROVIDER_SITE_OTHER): Payer: Medicare Other | Admitting: Psychiatry

## 2018-07-18 DIAGNOSIS — F339 Major depressive disorder, recurrent, unspecified: Secondary | ICD-10-CM | POA: Diagnosis not present

## 2018-07-19 ENCOUNTER — Telehealth: Payer: Self-pay | Admitting: Gastroenterology

## 2018-07-19 ENCOUNTER — Encounter: Payer: Self-pay | Admitting: Gastroenterology

## 2018-07-19 MED ORDER — NA SULFATE-K SULFATE-MG SULF 17.5-3.13-1.6 GM/177ML PO SOLN: 1.0000 | ORAL | 0 refills | Status: DC

## 2018-07-19 NOTE — Telephone Encounter (Signed)
No charge due to her illness. Thanks.

## 2018-07-19 NOTE — Telephone Encounter (Signed)
rx sent to pharmacy. Noted.

## 2018-07-19 NOTE — Telephone Encounter (Signed)
Noted  

## 2018-07-19 NOTE — Telephone Encounter (Signed)
Patient cx procedure for today, states that she did prep yesterday but because she is sick to day she cx appt and resch for 03/10. Pt will need a new prep sent to pharmacy

## 2018-07-19 NOTE — Telephone Encounter (Signed)
Patient cancelled procedure today and rescheduled for 03/10 states that she has a terrible cold (sore throat, chills, really bad cold) will you charge?

## 2018-07-27 ENCOUNTER — Encounter: Payer: Medicare Other | Admitting: Neurology

## 2018-07-31 ENCOUNTER — Other Ambulatory Visit (INDEPENDENT_AMBULATORY_CARE_PROVIDER_SITE_OTHER): Payer: Medicare Other

## 2018-07-31 DIAGNOSIS — E871 Hypo-osmolality and hyponatremia: Secondary | ICD-10-CM | POA: Diagnosis not present

## 2018-08-01 LAB — BASIC METABOLIC PANEL
BUN: 9 mg/dL (ref 7–25)
CO2: 28 mmol/L (ref 20–32)
Calcium: 9.6 mg/dL (ref 8.6–10.4)
Chloride: 96 mmol/L — ABNORMAL LOW (ref 98–110)
Creat: 0.69 mg/dL (ref 0.50–0.99)
Glucose, Bld: 96 mg/dL (ref 65–99)
Potassium: 4.2 mmol/L (ref 3.5–5.3)
Sodium: 132 mmol/L — ABNORMAL LOW (ref 135–146)

## 2018-08-01 LAB — EXTRA LAV TOP TUBE

## 2018-08-01 NOTE — Progress Notes (Signed)
Please call patient: I have reviewed his/her lab results. Sodium is improving. Suspect due to SIADH: treat with < 1.5 L of fluid per day, can add otc sodium chloride tablet daily Also please order cxr to f/u on CXR nodule seen on 08/2017 xray from [facility] hospitalization and SIADH. Please schedule a follow up appointment with me for recheck sodium and depression in 4 weeks. Thanks.   (reviewed hospital notes and labs from 08/2017 again: had hyponatremia thought related to SIADH at that time as well. CXR showed possible right nodule.)

## 2018-08-02 ENCOUNTER — Other Ambulatory Visit: Payer: Self-pay | Admitting: *Deleted

## 2018-08-02 ENCOUNTER — Ambulatory Visit (INDEPENDENT_AMBULATORY_CARE_PROVIDER_SITE_OTHER): Payer: Medicare Other | Admitting: Psychiatry

## 2018-08-02 ENCOUNTER — Ambulatory Visit
Admission: RE | Admit: 2018-08-02 | Discharge: 2018-08-02 | Disposition: A | Discharge status: Home / Self Care | Payer: Medicare Other | Source: Ambulatory Visit | Attending: Family Medicine | Admitting: Family Medicine

## 2018-08-02 ENCOUNTER — Ambulatory Visit (INDEPENDENT_AMBULATORY_CARE_PROVIDER_SITE_OTHER): Payer: Medicare Other

## 2018-08-02 DIAGNOSIS — E222 Syndrome of inappropriate secretion of antidiuretic hormone: Secondary | ICD-10-CM

## 2018-08-02 DIAGNOSIS — R911 Solitary pulmonary nodule: Secondary | ICD-10-CM

## 2018-08-02 DIAGNOSIS — F339 Major depressive disorder, recurrent, unspecified: Secondary | ICD-10-CM

## 2018-08-03 NOTE — Progress Notes (Signed)
Please call patient: I have reviewed his/her lab results. Good news: Chest xray is normal.

## 2018-08-06 ENCOUNTER — Telehealth: Payer: Self-pay | Admitting: Gastroenterology

## 2018-08-06 NOTE — Telephone Encounter (Signed)
Left instructions printed for the patient to pickup at reception. SM

## 2018-08-07 ENCOUNTER — Telehealth: Payer: Self-pay | Admitting: Gastroenterology

## 2018-08-07 ENCOUNTER — Encounter: Payer: Self-pay | Admitting: Gastroenterology

## 2018-08-07 ENCOUNTER — Other Ambulatory Visit: Payer: Self-pay

## 2018-08-07 ENCOUNTER — Ambulatory Visit (AMBULATORY_SURGERY_CENTER): Payer: Medicare Other | Admitting: Gastroenterology

## 2018-08-07 VITALS — BP 97/55 | HR 82 | Temp 98.4°F | Resp 18 | Ht 65.0 in | Wt 159.0 lb

## 2018-08-07 DIAGNOSIS — K635 Polyp of colon: Secondary | ICD-10-CM | POA: Diagnosis not present

## 2018-08-07 DIAGNOSIS — D123 Benign neoplasm of transverse colon: Secondary | ICD-10-CM

## 2018-08-07 DIAGNOSIS — R159 Full incontinence of feces: Secondary | ICD-10-CM

## 2018-08-07 DIAGNOSIS — D128 Benign neoplasm of rectum: Secondary | ICD-10-CM

## 2018-08-07 DIAGNOSIS — D129 Benign neoplasm of anus and anal canal: Secondary | ICD-10-CM

## 2018-08-07 DIAGNOSIS — D125 Benign neoplasm of sigmoid colon: Secondary | ICD-10-CM

## 2018-08-07 MED ORDER — SODIUM CHLORIDE 0.9 % IV SOLN: 500.0000 mL | Freq: Once | INTRAVENOUS | Status: DC

## 2018-08-07 NOTE — Telephone Encounter (Signed)
Pt is scheduled for colon today at 4 PM and reported that she has just thrown up her prep soln.  Please CB to advise.

## 2018-08-07 NOTE — Op Note (Signed)
[facility] Patient Name: Shannon Young Procedure Date: 08/07/2018 3:40 PM MRN: [id number] Endoscopist: Thornton Park MD, MD Age: 63 Referring MD:  Date of Birth: [birth year] Gender: Female Account #: [id number] Procedure:                Colonoscopy Indications:              Fecal incontinence.                           Family history of colon cancer (sister at age 82)                           Colonoscopy 2015 incomplete assessment of the right                            colon                           -Right colon appeared congested, random colon                            biopsies were normal                           - repeat exam recommended in 3 years Medicines:                See the Anesthesia note for documentation of the                            administered medications Procedure:                Pre-Anesthesia Assessment:                           - Prior to the procedure, a History and Physical                            was performed, and patient medications and                            allergies were reviewed. The patient's tolerance of                            previous anesthesia was also reviewed. The risks                            and benefits of the procedure and the sedation                            options and risks were discussed with the patient.                            All questions were answered, and informed consent                            was obtained. Prior Anticoagulants: The  patient has                            taken no previous anticoagulant or antiplatelet                            agents. ASA Grade Assessment: III - A patient with                            severe systemic disease. After reviewing the risks                            and benefits, the patient was deemed in                            satisfactory condition to undergo the procedure.                           After obtaining informed consent, the colonoscope                            was passed under direct vision. Throughout the                            procedure, the patient's blood pressure, pulse, and                            oxygen saturations were monitored continuously. The                            Colonoscope was introduced through the anus and                            advanced to the the cecum, identified by the                            appendiceal orifice, IC valve and                            transillumination. The colonoscopy was performed                            with moderate difficulty due to a redundant colon,                            significant looping and a tortuous colon.                            Successful completion of the procedure was aided by                            changing the patient to a supine position and  applying abdominal pressure. The patient tolerated                            the procedure well. The quality of the bowel                            preparation was good. The ileocecal valve,                            appendiceal orifice, and rectum were photographed. Scope In: 3:45:15 PM Scope Out: 4:11:24 PM Scope Withdrawal Time: 0 hours 16 minutes 26 seconds  Total Procedure Duration: 0 hours 26 minutes 9 seconds  Findings:                 The perianal and digital rectal examinations were                            normal.                           A 3 mm polyp was found in the transverse colon. The                            polyp was sessile. The polyp was removed with a                            cold snare. Resection and retrieval were complete.                            Estimated blood loss was minimal.                           A 5 mm polyp was found in the sigmoid colon. The                            polyp was sessile. The polyp was removed with a                            cold snare. Resection and retrieval were complete.                             Estimated blood loss: none.                           A 3 mm polyp was found in the rectum. The polyp was                            sessile. The polyp was removed with a cold snare.                            Resection and retrieval were complete. Estimated                            blood  loss was minimal.                           The colon (entire examined portion) appeared                            normal. Biopsies for histology were taken with a                            cold forceps from the entire colon for evaluation                            of microscopic colitis. Estimated blood loss was                            minimal.                           The exam was otherwise without abnormality on                            direct and retroflexion views. Complications:            No immediate complications. Estimated blood loss:                            Minimal. Estimated Blood Loss:     Estimated blood loss was minimal. Impression:               - One 3 mm polyp in the transverse colon, removed                            with a cold snare. Resected and retrieved.                           - One 5 mm polyp in the sigmoid colon, removed with                            a cold snare. Resected and retrieved.                           - One 3 mm polyp in the rectum, removed with a cold                            snare. Resected and retrieved.                           - The entire examined colon is normal. Biopsied.                           - The examination was otherwise normal on direct                            and retroflexion views. Recommendation:           - Patient has a  contact number available for                            emergencies. The signs and symptoms of potential                            delayed complications were discussed with the                            patient. Return to normal activities tomorrow.                            Written discharge  instructions were provided to the                            patient.                           - Resume regular diet.                           - Continue present medications.                           - Await pathology results.                           - Repeat colonoscopy date to be determined after                            pending pathology results are reviewed for                            surveillance based on pathology results.                           - Proceed with anorectal manometry for further                            evaluation. Thornton Park MD, MD 08/07/2018 4:25:01 PM This report has been signed electronically.

## 2018-08-07 NOTE — Telephone Encounter (Signed)
Called patient, she explained that she vomited after taking prep and drinking first bottle of water. Notified MD and she advised for patient to try a zofran (which she already has), and try and drink her other bottle of prep (that was left over from an earlier date when she had to cancel d/t the flu.) Patient knows she has to have all liquids consumed by 1:00pm for her procedure today at 4. She understands and agrees. Will call back if unable to tolerate second dose of prep. MD notified as well.

## 2018-08-07 NOTE — Progress Notes (Signed)
PT taken to PACU. Monitors in place. VSS. Report given to RN. 

## 2018-08-07 NOTE — Progress Notes (Signed)
Called to room to assist during endoscopic procedure.  Patient ID and intended procedure confirmed with present staff. Received instructions for my participation in the procedure from the performing physician.  

## 2018-08-07 NOTE — Patient Instructions (Signed)
   Information on polyps given to you today  Await pathology results on polyps removed and biopsies done   Resume usual diet and previous medications   YOU HAD AN ENDOSCOPIC PROCEDURE TODAY AT [facility]:   Refer to the procedure report that was given to you for any specific questions about what was found during the examination.  If the procedure report does not answer your questions, please call your gastroenterologist to clarify.  If you requested that your care partner not be given the details of your procedure findings, then the procedure report has been included in a sealed envelope for you to review at your convenience later.  YOU SHOULD EXPECT: Some feelings of bloating in the abdomen. Passage of more gas than usual.  Walking can help get rid of the air that was put into your GI tract during the procedure and reduce the bloating. If you had a lower endoscopy (such as a colonoscopy or flexible sigmoidoscopy) you may notice spotting of blood in your stool or on the toilet paper. If you underwent a bowel prep for your procedure, you may not have a normal bowel movement for a few days.  Please Note:  You might notice some irritation and congestion in your nose or some drainage.  This is from the oxygen used during your procedure.  There is no need for concern and it should clear up in a day or so.  SYMPTOMS TO REPORT IMMEDIATELY:   Following lower endoscopy (colonoscopy or flexible sigmoidoscopy):  Excessive amounts of blood in the stool  Significant tenderness or worsening of abdominal pains  Swelling of the abdomen that is new, acute  Fever of 100F or higher    For urgent or emergent issues, a gastroenterologist can be reached at any hour by calling [phone].   DIET:  We do recommend a small meal at first, but then you may proceed to your regular diet.  Drink plenty of fluids but you should avoid alcoholic beverages for 24 hours.  ACTIVITY:  You should plan  to take it easy for the rest of today and you should NOT DRIVE or use heavy machinery until tomorrow (because of the sedation medicines used during the test).    FOLLOW UP: Our staff will call the number listed on your records the next business day following your procedure to check on you and address any questions or concerns that you may have regarding the information given to you following your procedure. If we do not reach you, we will leave a message.  However, if you are feeling well and you are not experiencing any problems, there is no need to return our call.  We will assume that you have returned to your regular daily activities without incident.  If any biopsies were taken you will be contacted by phone or by letter within the next 1-3 weeks.  Please call us at [phone] if you have not heard about the biopsies in 3 weeks.    SIGNATURES/CONFIDENTIALITY: You and/or your care partner have signed paperwork which will be entered into your electronic medical record.  These signatures attest to the fact that that the information above on your After Visit Summary has been reviewed and is understood.  Full responsibility of the confidentiality of this discharge information lies with you and/or your care-partner.

## 2018-08-08 ENCOUNTER — Telehealth: Payer: Self-pay

## 2018-08-08 NOTE — Telephone Encounter (Signed)
First post procedure follow up call, no answer 

## 2018-08-08 NOTE — Telephone Encounter (Signed)
  Follow up Call-  Call back number 08/07/2018  Post procedure Call Back phone  # [phone]  Permission to leave phone message Yes     Patient questions:  Do you have a fever, pain , or abdominal swelling? No. Pain Score  0 *  Have you tolerated food without any problems? Yes.    Have you been able to return to your normal activities? Yes.    Do you have any questions about your discharge instructions: Diet   No. Medications  No. Follow up visit  No.  Do you have questions or concerns about your Care? No.  Actions: * If pain score is 4 or above: No action needed, pain <4.

## 2018-08-09 ENCOUNTER — Telehealth: Payer: Self-pay

## 2018-08-09 MED ORDER — SODIUM CHLORIDE 1 G PO TABS: 1.0000 g | ORAL_TABLET | Freq: Every day | ORAL | 1 refills | Status: DC

## 2018-08-09 NOTE — Telephone Encounter (Signed)
Patient aware that medicine has been called into pharmacy.

## 2018-08-09 NOTE — Telephone Encounter (Signed)
Hmmm.. should be over the counter but I sent in a rx.

## 2018-08-09 NOTE — Addendum Note (Signed)
Addended by: Billey Chang on: 08/09/2018 03:23 PM   Modules accepted: Orders

## 2018-08-09 NOTE — Telephone Encounter (Signed)
Patient called stating that she is unable to find sodium chloride tablets OTC. Is this a medication that can prescribed?

## 2018-08-13 ENCOUNTER — Encounter: Payer: Self-pay | Admitting: Gastroenterology

## 2018-08-16 ENCOUNTER — Ambulatory Visit: Payer: Medicare Other | Admitting: Psychiatry

## 2018-08-24 ENCOUNTER — Ambulatory Visit (INDEPENDENT_AMBULATORY_CARE_PROVIDER_SITE_OTHER): Payer: Medicare Other | Admitting: Family Medicine

## 2018-08-24 ENCOUNTER — Encounter: Payer: Self-pay | Admitting: Family Medicine

## 2018-08-24 ENCOUNTER — Other Ambulatory Visit: Payer: Self-pay

## 2018-08-24 DIAGNOSIS — R159 Full incontinence of feces: Secondary | ICD-10-CM | POA: Diagnosis not present

## 2018-08-24 DIAGNOSIS — E222 Syndrome of inappropriate secretion of antidiuretic hormone: Secondary | ICD-10-CM

## 2018-08-24 DIAGNOSIS — F339 Major depressive disorder, recurrent, unspecified: Secondary | ICD-10-CM

## 2018-08-24 DIAGNOSIS — D126 Benign neoplasm of colon, unspecified: Secondary | ICD-10-CM | POA: Insufficient documentation

## 2018-08-24 DIAGNOSIS — R32 Unspecified urinary incontinence: Secondary | ICD-10-CM

## 2018-08-24 DIAGNOSIS — E871 Hypo-osmolality and hyponatremia: Secondary | ICD-10-CM

## 2018-08-24 HISTORY — DX: Benign neoplasm of colon, unspecified: D12.6

## 2018-08-24 NOTE — Progress Notes (Signed)
Virtual Visit via Video Note  Subjective  CC:  Chief Complaint  Patient presents with  . Follow-up  . Depression    She reports that she is still having struggling and having difficulty, her PHQ9 today is 20.  . Sodium levels    Last labs were 07/31/18, sodium was 132 at that time    HPI:  I connected with Shannon Young on 08/24/18 at  1:30 PM EDT by a video enabled telemedicine application and verified that I am speaking with the correct person using two identifiers. Location patient: Home Location provider: [employer], Office Persons participating in the virtual visit: [name], Leamon Arnt, MD Lilli Light, [place] discussed the limitations of evaluation and management by telemedicine and the availability of in person appointments. The patient expressed understanding and agreed to proceed. . Clinical depression: No reports that her depression continues to be a major problem.  Her depression screen is very positive.  She reports low mood, anhedonia, hypersomnia, poor memory although this has improved, and decreased energy.  She has had depression for many many years.  She has been on multiple SSRIs in the past.  She reports that they were not helpful, however this was in the setting of active alcoholism.  Most recently she has been on Cymbalta for the last 3 to 6 months.  This was mainly tried to help with her chronic pain syndrome.  She feels it has not added any additional benefits.  She would be open to other medications.  She has never seen a psychiatrist.  She has no suicidal ideation, hallucinations.  She reports she is no longer drinking.  Depression screen [facility] 2/9 08/24/2018 10/17/2017  Decreased Interest 2 0  Down, Depressed, Hopeless 3 0  PHQ - 2 Score 5 0  Altered sleeping 3 0  Tired, decreased energy 3 0  Change in appetite 3 0  Feeling bad or failure about yourself  3 0  Trouble concentrating 3 0  Moving slowly or fidgety/restless 0 0   Suicidal thoughts 0 0  PHQ-9 Score 20 0  Difficult doing work/chores Not difficult at all Not difficult at all    Hyponatremia due to SIADH: Reviewed with patient this diagnosis first diagnosed about a year ago when she was in the ICU, critically ill.  Symptoms and sodium did improve but over the last 3 months have recurred.  Recent chest x-ray finding was normal.  She should be on a fluid restriction and sodium tablet daily, however she reports she has trouble with restricting her fluids due to dry mouth due to her medications and feeling dehydrated.  She reports that she drinks 6 sodas a day, usually caffeinated and 6 to 8 cups of coffee.  She rarely drinks water.  She says she cannot afford the flavored waters, sports drinks and has trouble using the powdered packets.  Her mentation has been normal.  Most recent sodium was 131 about 3 weeks ago.  Recently seeing GI for fecal incontinence.  Colonoscopy was reviewed.  Tubular adenoma with recommended surveillance in 7 years.  She is scheduled for rectal manometry next week for further evaluation due to her fecal incontinence.  Chronic pain managed by chronic pain specialist.  Continues on chronic narcotics.  No longer having problems with mentation that will likely related to medication side effects  Urinary incontinence: Mildly improved with Myrbetriq  Has follow-up with neurology in April for work-up regarding her mentation and memory issues.  Assessment  1. SIADH (syndrome of inappropriate ADH production) ([place])   2. Tubular adenoma of colon   3. Hyponatremia   4. Major depression, recurrent, chronic ([place])   5. Urinary incontinence, unspecified type   6. Full incontinence of feces      Plan   Hyponatremia due to SIADH, idiopathic: Will recheck lab work next week.  Did discuss importance of limiting sodas and caffeinated products, decreasing fluid intake and continuing salt tablets.  Patient was hesitant to make changes.  Major  depression: Recommend referral to psychiatry.  Complicated case.  Continue Cymbalta for now.  No suicidal ideation.  Continue other current medications I discussed the assessment and treatment plan with the patient. The patient was provided an opportunity to ask questions and all were answered. The patient agreed with the plan and demonstrated an understanding of the instructions.   The patient was advised to call back or seek an in-person evaluation if the symptoms worsen or if the condition fails to improve as anticipated. Follow up: Return in about 3 months (around 11/24/2018) for complete physical.  Visit date not found  No orders of the defined types were placed in this encounter.     I reviewed the patients updated PMH, FH, and SocHx.    Patient Active Problem List   Diagnosis Date Noted  . Recurrent major depressive disorder, in partial remission ([place]) 06/15/2018    Priority: High  . Alcohol dependence in remission ([place]) 10/17/2017    Priority: High  . Insomnia secondary to chronic pain 10/17/2017    Priority: High  . Spinal stenosis of lumbar region 10/17/2017    Priority: High  . Alcoholic hepatitis 81/05/7508    Priority: High  . Essential hypertension 06/29/2015    Priority: High  . Chronic pain syndrome 06/01/2010    Priority: High  . Opioid dependence ([place]) 06/01/2010    Priority: High  . Postlaminectomy syndrome, not elsewhere classified 06/01/2010    Priority: High  . DDD (degenerative disc disease), lumbosacral 03/19/2010    Priority: High  . Family history of ischemic heart disease (IHD) 06/29/2015    Priority: Medium  . GERD (gastroesophageal reflux disease) 05/10/2013    Priority: Medium  . Nonrheumatic mitral valve regurgitation 06/29/2015    Priority: Low  . Tubular adenoma of colon 08/24/2018  . Urinary incontinence 02/27/2018  . Paresthesia 02/27/2018  . Memory loss 11/23/2017  . Major depression, recurrent, chronic ([place]) 10/17/2017  . Delirium  tremens ([place]) 10/17/2017  . Orthostatic hypotension 06/29/2015   Current Meds  Medication Sig  . atorvastatin (LIPITOR) 20 MG tablet Take 1 tablet (20 mg total) by mouth at bedtime.  . DULoxetine (CYMBALTA) 60 MG capsule Take 1 capsule (60 mg total) by mouth daily.  . fentaNYL (DURAGESIC - DOSED MCG/HR) 100 MCG/HR Place onto the skin. Pt says she uses the 25 MCG PATC  . fluticasone (FLONASE) 50 MCG/ACT nasal spray Place 1 spray into both nostrils as needed.   . folic acid (FOLVITE) 1 MG tablet Take by mouth.  . losartan (COZAAR) 100 MG tablet TAKE 1 TABLET BY MOUTH ONCE DAILY  . mirabegron ER (MYRBETRIQ) 25 MG TB24 tablet Take 25 mg by mouth daily.  . Multiple Vitamin (MULTIVITAMIN) capsule Take by mouth daily.   . naloxone (NARCAN) nasal spray 4 mg/0.1 mL Spray into one nostril for suspected overdose. Repeat x1, if no response after 3 minutes.  Dispense one package with 2 nasal sprays.  Marland Kitchen oxycodone (ROXICODONE) 30 MG immediate release  tablet Take 60 mg by mouth 4 (four) times daily.   . pantoprazole (PROTONIX) 40 MG tablet Take 1 tablet (40 mg total) by mouth 2 (two) times daily.  . sodium chloride 1 g tablet Take 1 tablet (1 g total) by mouth daily.  Marland Kitchen thiamine 100 MG tablet Take by mouth.  . traZODone (DESYREL) 50 MG tablet Take 50-100 mg by mouth at bedtime.  . vitamin B-12 (CYANOCOBALAMIN) 500 MCG tablet Take 500 mcg by mouth daily.    Allergies: Patient is allergic to bupropion and gabapentin. Family History: Patient family history includes Alcohol abuse in her brother; Colon cancer in her sister; Healthy in her daughter, daughter, daughter, and son; Hyperlipidemia in her father; Hypertension in her brother, father, and mother. Social History:  Patient  reports that she has quit smoking. She has never used smokeless tobacco. She reports previous alcohol use. She reports current drug use. Drugs: Fentanyl and Oxycodone.  @OBJECTIVE /OBSERVATIONS@ Vitals: There were no vitals taken  for this visit. General: no acute distress , A&Ox3, no sedation Psych: appears well, normal mentation and speech.   Lab Results  Component Value Date   CREATININE 0.69 07/31/2018   BUN 9 07/31/2018   NA 132 (L) 07/31/2018   K 4.2 07/31/2018   CL 96 (L) 07/31/2018   CO2 28 07/31/2018    I provided 30 minutes of non-face-to-face time during this encounter. Leamon Arnt, MD

## 2018-08-24 NOTE — Progress Notes (Signed)
I have discussed the procedure for the virtual visit with the patient who has given consent to proceed with assessment and treatment.   Jacari Iannello S Cheston Coury, CMA     

## 2018-08-24 NOTE — Patient Instructions (Addendum)
Please return in 3 months for your annual complete physical; please come fasting. Lab visit for labs next week.  Please call a psychiatric office to establish care to help treat your clinical depression. Some choices are noted below but there are other options in [place].   If you have any questions or concerns, please don't hesitate to send me a message via MyChart or call the office at [phone]. Thank you for visiting with [place] today! It's our pleasure caring for you.  Psychiatrists  [facility] [address] [place], [place] [address] [phone]  [place], NP [address], [place], [place] [address] [place], [place] [address] [phone]  [facility] Pauline Good, NP Magda Paganini NP [address] [place], [place] [address] [phone]  Counseling centers only:  [facility] [address] [place], [place] [facility]: [facility] [address] [place] [address] [place], [place] [phone]   [facility] [address] [place], [place] [address] [place], [place] [address] [place]  [facility] [place] [address] [place], [place] [address] [phone]

## 2018-08-27 ENCOUNTER — Other Ambulatory Visit (INDEPENDENT_AMBULATORY_CARE_PROVIDER_SITE_OTHER): Payer: Medicare Other

## 2018-08-27 DIAGNOSIS — E222 Syndrome of inappropriate secretion of antidiuretic hormone: Secondary | ICD-10-CM | POA: Diagnosis not present

## 2018-08-27 LAB — BASIC METABOLIC PANEL
BUN: 8 mg/dL (ref 6–23)
CO2: 26 mEq/L (ref 19–32)
Calcium: 9.4 mg/dL (ref 8.4–10.5)
Chloride: 99 mEq/L (ref 96–112)
Creatinine, Ser: 0.64 mg/dL (ref 0.40–1.20)
GFR: 94.35 mL/min (ref >60.00)
Glucose, Bld: 108 mg/dL — ABNORMAL HIGH (ref 70–99)
Potassium: 4.4 mEq/L (ref 3.5–5.1)
Sodium: 134 mEq/L — ABNORMAL LOW (ref 135–145)

## 2018-08-27 NOTE — Progress Notes (Signed)
Please call patient: I have reviewed his/her lab results. Please let her know that her sodium is improved at 134. Continue current medications and fluid restriction. Recheck in 4 weeks. Lab visit. Thanks.

## 2018-08-28 ENCOUNTER — Ambulatory Visit: Payer: Self-pay | Admitting: Family Medicine

## 2018-08-28 DIAGNOSIS — M542 Cervicalgia: Secondary | ICD-10-CM | POA: Diagnosis not present

## 2018-08-28 DIAGNOSIS — M5440 Lumbago with sciatica, unspecified side: Secondary | ICD-10-CM | POA: Diagnosis not present

## 2018-08-30 ENCOUNTER — Other Ambulatory Visit: Payer: Self-pay

## 2018-08-30 ENCOUNTER — Ambulatory Visit: Payer: Self-pay | Admitting: Family Medicine

## 2018-08-30 ENCOUNTER — Ambulatory Visit: Payer: Medicare Other | Admitting: Psychiatry

## 2018-09-12 ENCOUNTER — Encounter

## 2018-09-12 ENCOUNTER — Encounter: Payer: Medicare Other | Admitting: Neurology

## 2018-09-13 ENCOUNTER — Ambulatory Visit: Payer: Medicare Other | Admitting: Psychiatry

## 2018-09-20 DIAGNOSIS — G8929 Other chronic pain: Secondary | ICD-10-CM | POA: Diagnosis not present

## 2018-09-20 DIAGNOSIS — M5442 Lumbago with sciatica, left side: Secondary | ICD-10-CM | POA: Diagnosis not present

## 2018-09-20 DIAGNOSIS — M5441 Lumbago with sciatica, right side: Secondary | ICD-10-CM | POA: Diagnosis not present

## 2018-09-20 DIAGNOSIS — M5440 Lumbago with sciatica, unspecified side: Secondary | ICD-10-CM | POA: Diagnosis not present

## 2018-10-05 ENCOUNTER — Ambulatory Visit (HOSPITAL_COMMUNITY): Admission: RE | Admit: 2018-10-05 | Payer: Medicare Other | Source: Home / Self Care | Admitting: Gastroenterology

## 2018-10-05 ENCOUNTER — Encounter (HOSPITAL_COMMUNITY): Admission: RE | Payer: Self-pay | Source: Home / Self Care

## 2018-10-05 SURGERY — MANOMETRY, ANORECTAL

## 2018-10-08 ENCOUNTER — Telehealth: Payer: Self-pay | Admitting: Family Medicine

## 2018-10-08 NOTE — Telephone Encounter (Signed)
Copied from [place] [phone]. Topic: General - Inquiry >> Oct 08, 2018  1:16 PM Scherrie Gerlach wrote: Reason for CRM: pt states her surgeon in [place], [place] , Dr Thereasa Distance, wants her to have xrays done of her back, and wants to know where she should send the order. The sugeon is going to fax order to dr Jonni Sanger, and pt would like a quick response on where she can have the xray done.

## 2018-10-08 NOTE — Telephone Encounter (Signed)
Her physician can send order and we can have xray done here.

## 2018-10-08 NOTE — Telephone Encounter (Signed)
Please advise 

## 2018-10-08 NOTE — Telephone Encounter (Signed)
Pt aware and having orders sent to office

## 2018-10-10 ENCOUNTER — Telehealth: Payer: Self-pay | Admitting: Family Medicine

## 2018-10-10 ENCOUNTER — Other Ambulatory Visit: Payer: Self-pay | Admitting: *Deleted

## 2018-10-10 DIAGNOSIS — Z981 Arthrodesis status: Secondary | ICD-10-CM

## 2018-10-10 NOTE — Telephone Encounter (Signed)
Order placed pt aware and scheduled

## 2018-10-10 NOTE — Telephone Encounter (Signed)
  Copied from [place] [phone]. Topic: General - Inquiry >> Oct 08, 2018  1:16 PM Scherrie Gerlach wrote: Reason for CRM: pt states her surgeon in [place], [place] , Dr Thereasa Distance, wants her to have xrays done of her back, and wants to know where she should send the order. The sugeon is going to fax order to dr Jonni Sanger, and pt would like a quick response on where she can have the xray done. >> Oct 10, 2018 12:15 PM Carole Binning B wrote: Patient is calling to check the status of the xray. I do not see orders for an xray and am unsure if Dr. Tamela Oddi team received a fax about orders. Please contact patient to clarify when xray can be done.  >> Oct 10, 2018  1:02 PM Layla Barter, CMA wrote: XR THORACOLUMBAR SPINE SUPINE AND ERECT SCOLIOSIS order was received today. However; I do not see that this can be ordered in our system. Please advise

## 2018-10-17 ENCOUNTER — Other Ambulatory Visit: Payer: Medicare Other

## 2018-10-17 ENCOUNTER — Ambulatory Visit (INDEPENDENT_AMBULATORY_CARE_PROVIDER_SITE_OTHER): Payer: Medicare Other

## 2018-10-17 DIAGNOSIS — Z981 Arthrodesis status: Secondary | ICD-10-CM | POA: Diagnosis not present

## 2018-10-18 ENCOUNTER — Encounter: Payer: Self-pay | Admitting: *Deleted

## 2018-10-18 DIAGNOSIS — M5440 Lumbago with sciatica, unspecified side: Secondary | ICD-10-CM | POA: Diagnosis not present

## 2018-10-18 DIAGNOSIS — M961 Postlaminectomy syndrome, not elsewhere classified: Secondary | ICD-10-CM | POA: Diagnosis not present

## 2018-10-18 NOTE — Progress Notes (Signed)
Please call patient: I have reviewed his/her lab results. Results of xray have been reported. Can route to her specialist. Can give copy of xrays/CD as well?

## 2018-10-25 ENCOUNTER — Telehealth: Payer: Self-pay

## 2018-10-25 NOTE — Telephone Encounter (Signed)
I called and spoke to Shannon Young about rescheduling her nerve conduction study with Dr. Krista Blue and she stated that she does still want to have this done but she was not in a position to schedule at this current moment and said that she would call back a little later to schedule.

## 2018-10-29 ENCOUNTER — Encounter: Payer: Self-pay | Admitting: *Deleted

## 2018-10-29 ENCOUNTER — Telehealth: Payer: Self-pay | Admitting: *Deleted

## 2018-10-29 ENCOUNTER — Other Ambulatory Visit: Payer: Self-pay | Admitting: *Deleted

## 2018-10-29 DIAGNOSIS — R159 Full incontinence of feces: Secondary | ICD-10-CM

## 2018-10-29 NOTE — Telephone Encounter (Signed)
Left a message for patient to call back.  Ano rectal mano scheduled at [facility] on Monday 11/05/2018 at 8:30 am. Case number: [id number] COVID-19 screening on Thursday 11/01/2018 at 9:50 am.

## 2018-10-30 NOTE — Telephone Encounter (Signed)
Spoke to patient, notified patient of upcoming an rectal mano. Patient verbalized understanding. Instructions sent via MyChart.

## 2018-10-31 ENCOUNTER — Telehealth: Payer: Self-pay | Admitting: Family Medicine

## 2018-10-31 NOTE — Telephone Encounter (Signed)
See note  Copied from [place] [phone]. Topic: General - Other >> Oct 31, 2018  8:32 AM Leward Quan A wrote: Reason for CRM: Patient called to check on status of test X rays that were to be sent to her surgeon in [place]. Should go to Dr Knute Neu Ph# [phone] or [phone] Fax# [phone]. Also she never received the list of Psychiatrists to choose from as discussed at last encounter. Please advise Ph# [phone]

## 2018-11-01 ENCOUNTER — Other Ambulatory Visit (HOSPITAL_COMMUNITY): Payer: Medicare Other

## 2018-11-01 NOTE — Telephone Encounter (Signed)
Spoke to the patient who wanted the ano rectal mano rescheduled.   Rescheduled for Monday, 11/26/2018 at 8:30 am.  COVID screening Thursday, 11/22/2018 at 9:05 am.  Patient told to call back if she needs to reschedule again. Nothing further at the time of the phone call.

## 2018-11-01 NOTE — Telephone Encounter (Signed)
Pt would like to reschedule her anorectal mano.

## 2018-11-02 NOTE — Telephone Encounter (Signed)
Pt aware CD being mailed to Dr. Thereasa Distance

## 2018-11-02 NOTE — Telephone Encounter (Signed)
Spoke with pt and she is aware that we are getting CD of recent xrays. I advised her on how to look at instructions on Mychart for psychiatrists and of not found I can mail the information.

## 2018-11-08 ENCOUNTER — Other Ambulatory Visit: Payer: Self-pay | Admitting: *Deleted

## 2018-11-08 DIAGNOSIS — R159 Full incontinence of feces: Secondary | ICD-10-CM

## 2018-11-12 ENCOUNTER — Encounter: Payer: Self-pay | Admitting: *Deleted

## 2018-11-12 ENCOUNTER — Telehealth: Payer: Self-pay | Admitting: *Deleted

## 2018-11-12 NOTE — Telephone Encounter (Signed)
This RN noticed that the anorectal mano was on the incorrect date (7/6). Called scheduling the get it fixed. This RN was told that the schedulers made a mistake and clicked the wrong date.   6/26 at 12:30 pm at [facility] (this is 4 hours after the original time of 8:30 am)  Case number: [id number]  Left message for patient as instructed.

## 2018-11-12 NOTE — Telephone Encounter (Signed)
Entered in error

## 2018-11-15 DIAGNOSIS — M961 Postlaminectomy syndrome, not elsewhere classified: Secondary | ICD-10-CM | POA: Diagnosis not present

## 2018-11-15 DIAGNOSIS — M542 Cervicalgia: Secondary | ICD-10-CM | POA: Diagnosis not present

## 2018-11-15 DIAGNOSIS — M5440 Lumbago with sciatica, unspecified side: Secondary | ICD-10-CM | POA: Diagnosis not present

## 2018-11-22 ENCOUNTER — Other Ambulatory Visit (HOSPITAL_COMMUNITY)
Admission: RE | Admit: 2018-11-22 | Discharge: 2018-11-22 | Disposition: A | Discharge status: Home / Self Care | Payer: Medicare Other | Source: Ambulatory Visit | Attending: Gastroenterology | Admitting: Gastroenterology

## 2018-11-22 DIAGNOSIS — Z1159 Encounter for screening for other viral diseases: Secondary | ICD-10-CM | POA: Diagnosis not present

## 2018-11-22 LAB — SARS CORONAVIRUS 2 (TAT 6-24 HRS): SARS Coronavirus 2: NEGATIVE

## 2018-11-26 ENCOUNTER — Encounter: Payer: Medicare Other | Admitting: Family Medicine

## 2018-11-26 ENCOUNTER — Encounter (HOSPITAL_COMMUNITY)
Admission: RE | Disposition: A | Discharge status: Home / Self Care | Payer: Self-pay | Source: Home / Self Care | Attending: Gastroenterology

## 2018-11-26 ENCOUNTER — Ambulatory Visit (HOSPITAL_COMMUNITY)
Admission: RE | Admit: 2018-11-26 | Discharge: 2018-11-26 | Disposition: A | Discharge status: Home / Self Care | Payer: Medicare Other | Attending: Gastroenterology | Admitting: Gastroenterology

## 2018-11-26 DIAGNOSIS — R159 Full incontinence of feces: Secondary | ICD-10-CM

## 2018-11-26 HISTORY — PX: ANAL RECTAL MANOMETRY: SHX6358

## 2018-11-26 SURGERY — MANOMETRY, ANORECTAL

## 2018-11-26 NOTE — Progress Notes (Signed)
Anal rectal manometry done per protocol.  Pt tolerated well.  Balloon expulsion test done with 50 cc of warm water.  Balloon was expelled at 20 sec. Report to be sent to Dr. Silverio Decamp.

## 2018-11-27 ENCOUNTER — Encounter (HOSPITAL_COMMUNITY): Payer: Self-pay | Admitting: Gastroenterology

## 2018-11-29 DIAGNOSIS — R159 Full incontinence of feces: Secondary | ICD-10-CM

## 2018-12-03 DIAGNOSIS — F319 Bipolar disorder, unspecified: Secondary | ICD-10-CM | POA: Diagnosis not present

## 2018-12-03 DIAGNOSIS — F411 Generalized anxiety disorder: Secondary | ICD-10-CM | POA: Diagnosis not present

## 2018-12-04 ENCOUNTER — Other Ambulatory Visit: Payer: Self-pay | Admitting: *Deleted

## 2018-12-04 DIAGNOSIS — R278 Other lack of coordination: Secondary | ICD-10-CM

## 2018-12-11 ENCOUNTER — Other Ambulatory Visit: Payer: Self-pay

## 2018-12-11 ENCOUNTER — Other Ambulatory Visit: Payer: Self-pay | Admitting: Orthopaedic Surgery

## 2018-12-11 DIAGNOSIS — IMO0001 Reserved for inherently not codable concepts without codable children: Secondary | ICD-10-CM

## 2018-12-13 ENCOUNTER — Other Ambulatory Visit: Payer: Self-pay | Admitting: *Deleted

## 2018-12-13 DIAGNOSIS — M542 Cervicalgia: Secondary | ICD-10-CM | POA: Diagnosis not present

## 2018-12-13 DIAGNOSIS — M5440 Lumbago with sciatica, unspecified side: Secondary | ICD-10-CM | POA: Diagnosis not present

## 2018-12-13 DIAGNOSIS — G894 Chronic pain syndrome: Secondary | ICD-10-CM | POA: Diagnosis not present

## 2018-12-13 DIAGNOSIS — M5442 Lumbago with sciatica, left side: Secondary | ICD-10-CM | POA: Diagnosis not present

## 2018-12-13 MED ORDER — FENTANYL 75 MCG/HR TD PT72: 1.0000 | MEDICATED_PATCH | TRANSDERMAL | 0 refills | Status: AC

## 2018-12-18 ENCOUNTER — Ambulatory Visit: Payer: Medicare Other | Admitting: Physical Therapy

## 2018-12-19 ENCOUNTER — Ambulatory Visit: Payer: Medicare Other | Attending: Gastroenterology | Admitting: Physical Therapy

## 2018-12-19 ENCOUNTER — Encounter: Payer: Self-pay | Admitting: Physical Therapy

## 2018-12-19 ENCOUNTER — Other Ambulatory Visit: Payer: Self-pay

## 2018-12-19 DIAGNOSIS — R159 Full incontinence of feces: Secondary | ICD-10-CM

## 2018-12-19 DIAGNOSIS — R278 Other lack of coordination: Secondary | ICD-10-CM

## 2018-12-19 DIAGNOSIS — R32 Unspecified urinary incontinence: Secondary | ICD-10-CM

## 2018-12-19 DIAGNOSIS — M6281 Muscle weakness (generalized): Secondary | ICD-10-CM | POA: Diagnosis not present

## 2018-12-19 NOTE — Therapy (Signed)
[facility] [address] [place], [place], [address] Phone: [phone]   Fax:  [phone]  Physical Therapy Evaluation  Patient Details  Name: Shannon Young MRN: [id number] Date of Birth: [birth year] Referring Provider (PT): Dr. Thornton Park   Encounter Date: 12/19/2018  PT End of Session - 12/19/18 1407    Visit Number  1    Authorization Type  Medicare    PT Start Time  6811   came to the wrong place   PT Stop Time  1355    PT Time Calculation (min)  34 min    Activity Tolerance  Patient tolerated treatment well;No increased pain    Behavior During Therapy  WFL for tasks assessed/performed       Past Medical History:  Diagnosis Date  . Alcoholic hepatitis 5/72/6203  . Delirium tremens ([place]) 10/17/2017   Acute alcohol and benzo w/d: 08/2017  . Depression   . GERD (gastroesophageal reflux disease)   . Heart murmur   . Hyperlipidemia   . Hypertension   . Osteopenia   . Tubular adenoma of colon 08/24/2018   Colonoscopy 07/2018, Dr. Laurier Nancy, repeat in 7 years.   . Vision abnormalities     Past Surgical History:  Procedure Laterality Date  . ANAL RECTAL MANOMETRY N/A 11/26/2018   Procedure: ANO RECTAL MANOMETRY;  Surgeon: Thornton Park, MD;  Location: [facility];  Service: Gastroenterology;  Laterality: N/A;  . BACK SURGERY  2008,2017,2019   x 3, electrical implant and explant and attempt at insertion of pain pump  . BUNIONECTOMY  2003  . COLONOSCOPY    . MANDIBLE SURGERY  2005   repaired for proper function of mandible  . POSTERIOR LUMBAR VERTEBRAE EXCISION  09/15/2017   spacers put in L-4- L5 or 6 per pt statement  . TOTAL SHOULDER REPLACEMENT Left 2015    There were no vitals filed for this visit.   Subjective Assessment - 12/19/18 1322    Subjective  Patient has had fecal incontinence and has been getting worse in the past year. Patient reprots she has stool loss and not knowing it  happened. May not happen for days or weeks. Patient reports no pain rectally.    Diagnostic tests  x-ray shows a screw is broken in back, patient has a PT order for her back but has not scheduled an eval    Patient Stated Goals  more control over rectal muscles, work on back pain    Currently in Pain?  Yes    Pain Score  9    averages 7/10   Pain Location  Back    Pain Orientation  Lower    Pain Descriptors / Indicators  Sharp;Stabbing;Burning    Pain Type  Chronic pain    Pain Onset  More than a month ago    Pain Frequency  Intermittent    Aggravating Factors   sitting, standing, doing activity too long    Pain Relieving Factors  stand and move around    Multiple Pain Sites  No         [facility] PT Assessment - 12/19/18 0001      Assessment   Medical Diagnosis  R27.8 Dyssynergia    Referring Provider (PT)  Dr. Thornton Park    Onset Date/Surgical Date  12/18/17      Precautions   Precautions  Other (comment)    Precaution Comments  prior back surgery      Restrictions   Weight  Bearing Restrictions  No      Balance Screen   Has the patient fallen in the past 6 months  Yes    How many times?  2   reports it is not balance, due to dog pulling her   Has the patient had a decrease in activity level because of a fear of falling?   No    Is the patient reluctant to leave their home because of a fear of falling?   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    Leisure  drawing, painting, photography      Cognition   Overall Cognitive Status  Within Functional Limits for tasks assessed      Posture/Postural Control   Posture/Postural Control  Postural limitations    Postural Limitations  Rounded Shoulders;Forward head      ROM / Strength   AROM / PROM / Strength  AROM;PROM;Strength      AROM    Lumbar - Right Side Bend  full but goes slow    Lumbar - Left Side Bend  full but goes slowly      Strength   Overall Strength Comments  difficulty with standing on one leg with increased trunk sway    Right Hip Flexion  4/5    Right Hip External Rotation   4/5    Right Hip ABduction  4/5    Left Hip Flexion  4/5    Left Hip External Rotation  4/5    Left Hip ABduction  4/5    Left Hip ADduction  4/5      Palpation   SI assessment   pelvis in correct alignment                Objective measurements completed on examination: See above findings.    Pelvic Floor Special Questions - 12/19/18 0001    Prior Pregnancies  Yes    Number of Pregnancies  3   twins   Number of Vaginal Deliveries  4    Currently Sexually Active  No    Marinoff Scale  no problems    Urinary Leakage  Yes    Pad use  2   2 pads at night   Activities that cause leaking  Walking;Coughing;Sneezing;Laughing;With strong urge   housework   Urinary frequency  every hour     Fecal incontinence  Yes   rocks, loose when she does not feel it   Caffeine beverages  drinks sodas and caffiene    Pelvic Floor Internal Exam  Patient confirms identification and approves PT to assess pelvic floor strength and treatment    Exam Type  Rectal    Palpation  puborectalis does not com forward, tightness anterior rectum, patient needs tactile cues to contract the anal canal    Strength  weak squeeze, no lift               PT Education - 12/19/18 1356    Education Details  pelvic floor contraction, reduction of caffiene    Person(s) Educated  Patient    Methods  Explanation;Demonstration;Handout    Comprehension  Returned demonstration;Verbalized understanding       PT Short Term Goals - 12/19/18 1426      PT  SHORT TERM GOAL #1   Title  independent with initial HEP    Time  4    Period  Weeks    Status  New    Target Date  01/16/19      PT SHORT TERM GOAL #2   Title  able to contract the rectum  with circular contraction and lift and puborectalis coming forward    Time  4    Period  Weeks    Status  New    Target Date  01/16/19      PT SHORT TERM GOAL #3   Title  understand how caffiene and sodas affect the bladder and why she should drink more water    Time  4    Period  Weeks    Status  New    Target Date  01/16/19      PT SHORT TERM GOAL #4   Title  fecal leakage decreased >/= 25% due to increased muscle control    Time  4    Period  Weeks    Status  New    Target Date  01/16/19        PT Long Term Goals - 12/19/18 1428      PT LONG TERM GOAL #1   Title  independent with HEP and understand how to progress herself    Time  8    Period  Weeks    Status  New    Target Date  02/13/19      PT LONG TERM GOAL #2   Title  fecal leakage improved >/= 75% due to improved sphincter tone and strength >/= 3/5    Time  8    Period  Weeks    Status  New    Target Date  02/13/19      PT LONG TERM GOAL #3   Title  urinary leakage decreased >/= 50% due to improved pelvic floor strength    Time  8    Period  Weeks    Status  New    Target Date  02/13/19      PT LONG TERM GOAL #4   Title  improved feeling of the anal sphincter when stool is coming >/= 50% out due to improve tone    Time  8    Period  Weeks    Status  New    Target Date  02/13/19      PT LONG TERM GOAL #5   Title  able to fully evacuate the stool due to improve tone of the sphincter    Time  8    Period  Weeks    Status  New    Target Date  02/13/19             Plan - 12/19/18 1408    Clinical Impression Statement  Patient is a 63 year old female with fecal and urinary incontinence. Patient has chronic lumbar pain with multiple surgeries at level 9/10. Patient reports being in one position for long period of time increases pain. Bilateral hip strength is 4/5. Pelvic floor strength 2/5 and needed tactile cues to make a circular contraction. Decreased contraction of the puborectalis and  anterior portion of the IAS and EAS. Patient reports her stool is typically Type 4 but can be a 2. Patient will leak stool inconsistently. Patient wears 2 pads during the day and 1 at night. Patient reports urinary leakage with activities and with the urge. Patient does not  always feel she fully empty her rectum. Patient will benefit from skilled therapy to improve pelvic floor muscle coordination and strength to reduce leakage.    Personal Factors and Comorbidities  Fitness;Comorbidity 1;Comorbidity 2;Comorbidity 3+    Comorbidities  major depression, 5 back surgeries to lumbar, adenoma of colon    Examination-Activity Limitations  Continence;Stand;Toileting;Sit    Examination-Participation Restrictions  Interpersonal Relationship    Stability/Clinical Decision Making  Evolving/Moderate complexity    Clinical Decision Making  Moderate    Rehab Potential  Good    PT Frequency  1x / week    PT Duration  8 weeks    PT Treatment/Interventions  Biofeedback;Therapeutic activities;Therapeutic exercise;Patient/family education;Neuromuscular re-education;Manual techniques    PT Next Visit Plan  work on fascial restrictions to the anus, work on anal contraction and bringing puborectalis forward, abdominal massage, toileting technique    Consulted and Agree with Plan of Care  Patient       Patient will benefit from skilled therapeutic intervention in order to improve the following deficits and impairments:  Decreased coordination, Increased fascial restricitons, Decreased endurance, Increased muscle spasms, Pain, Decreased strength  Visit Diagnosis: 1. Muscle weakness (generalized)   2. Other lack of coordination   3. Incontinence of feces, unspecified fecal incontinence type   4. Urinary incontinence, unspecified type        Problem List Patient Active Problem List   Diagnosis Date Noted  . Incontinence of feces   . Tubular adenoma of colon 08/24/2018  . Recurrent major depressive disorder,  in partial remission ([place]) 06/15/2018  . Urinary incontinence 02/27/2018  . Paresthesia 02/27/2018  . Memory loss 11/23/2017  . Major depression, recurrent, chronic ([place]) 10/17/2017  . Alcohol dependence in remission ([place]) 10/17/2017  . Insomnia secondary to chronic pain 10/17/2017  . Spinal stenosis of lumbar region 10/17/2017  . Alcoholic hepatitis 17/51/0258  . Delirium tremens ([place]) 10/17/2017  . Essential hypertension 06/29/2015  . Family history of ischemic heart disease (IHD) 06/29/2015  . Nonrheumatic mitral valve regurgitation 06/29/2015  . Orthostatic hypotension 06/29/2015  . GERD (gastroesophageal reflux disease) 05/10/2013  . Chronic pain syndrome 06/01/2010  . Opioid dependence ([place]) 06/01/2010  . Postlaminectomy syndrome, not elsewhere classified 06/01/2010  . DDD (degenerative disc disease), lumbosacral 03/19/2010    Earlie Counts, PT 12/19/18 2:57 PM   [facility] [address] [place], [place], [address] Phone: [phone]   Fax:  [phone]  Name: Taylore Hinde MRN: [id number] Date of Birth: 20-May-1956

## 2018-12-19 NOTE — Patient Instructions (Addendum)
Slow Contraction: Gravity Eliminated (Side-Lying)    Lie on left side, hips and knees slightly bent. Slowly squeeze pelvic floor for _5__ seconds. Rest for _5__ seconds. Repeat _5__ times. Do _5__ times a day.   Copyright  VHI. All rights reserved.  Reduce amount of caffeine, drink more water  [facility] [address] [place], [place] [address] Phone # [phone] Fax [phone]

## 2018-12-31 ENCOUNTER — Encounter: Payer: Self-pay | Admitting: Gastroenterology

## 2018-12-31 ENCOUNTER — Ambulatory Visit (INDEPENDENT_AMBULATORY_CARE_PROVIDER_SITE_OTHER): Payer: Medicare Other | Admitting: Gastroenterology

## 2018-12-31 ENCOUNTER — Ambulatory Visit: Payer: Medicare Other | Admitting: Physical Therapy

## 2018-12-31 VITALS — BP 130/76 | HR 76 | Temp 98.3°F | Ht 64.75 in | Wt 157.2 lb

## 2018-12-31 DIAGNOSIS — R278 Other lack of coordination: Secondary | ICD-10-CM

## 2018-12-31 DIAGNOSIS — R159 Full incontinence of feces: Secondary | ICD-10-CM

## 2018-12-31 DIAGNOSIS — F319 Bipolar disorder, unspecified: Secondary | ICD-10-CM | POA: Diagnosis not present

## 2018-12-31 NOTE — Patient Instructions (Addendum)
Please continue with the physical therapy. Given your manometry, this should improve control.   Avoid sugars and caffeine. Carbonated water recommended.  Keep your bottom clean and dry without excessive wiping or using astringent cleaners.  Apply a barrier cream such as zinc oxide to the perianal skin as needed.   Please add something like psyllium to your daily regimen. You could consider an daily fiber supplement including fiber gummies.  We should plan another colonoscopy in 5 years given your history of a precancerous polyp and your family history of colon cancer.   Let's plan to check in after a few months. Please call with any questions or concerns before that time.

## 2018-12-31 NOTE — Progress Notes (Signed)
Referring Provider: Leamon Arnt, MD Primary Care Physician:  Leamon Arnt, MD   Reason for Consultation:  Fecal incontinence   IMPRESSION:  Dyssynergic defecation Weak internal and external anal sphincter on anorectal manometry GERD controlled on pantoprazole    -Esophageal biopsies negative for eosinophilic esophagitis 8502 3 cm hiatal hernia on EGD 2015 at [facility] of unexplained nausea and vomiting thought to be related to narcotic use for back pain    - evaluated by GI in [place], [place]    -Normal gastric and duodenal biopsies 2015    - symptoms controlled with Zofran Family history of colon cancer (sister at age 84) Family history of ulcerative colitis History of heavy alcohol use History of colon polyp    - one tubular adenoma on colonoscopy 08/07/2018    - surveillance colonoscopy in 5 years  Anal rectal manometry shows dyssynergic defecation as well as weak internal and external anal sphincters.  I have recommended that she continue daily psyllium for stool bulk.   Intensive pelvic floor retraining exercises and biofeedback training are recommended. Aggressive management of constipation is appropriate.  This involves adequate dietary fiber (up to 25-30 g per day) and fluid intake. Regular exercise is encouraged.   I recommend an evening dose of fiber supplement with a morning routine of mild physical activity; a hot, preferably caffeinated beverage; and, possibly, a fiber cereal followed by another cup of a hot beverage - all within 45 minutes of waking. This routine augments early morning high-amplitude peristaltic contractions by incorporating multiple colon stimulators.   PLAN: Psyllium to add stool bulk Daily fiber - including gummies Colonoscopy in 5 years given the history of colon polyp Follow-up in 4 months or earlier as necessary  HPI: Shannon Young is a 63 y.o. female under evaluation of fecal incontinence.  The interval history is obtained  through the patient and review of her electronic medical record. She has chronic cervical axial pain with stenosis at 6 C6-7 with mild left foraminal narrowing.  She has midline low back pain without sciatica. Some urinary incontinence.  Previously seen by [facility] and a C-spine MRI showed no spinal cord impingement.  Seen by [facility].   After our consultation she had a colonoscopy 08/07/18 that showed 3 small polyps, one of which was an adenoma. The others were hyperplastic polyps. Random colon biopsies were normal.   Anal rectal manometry 11/26/2018 showed a low anal sphincter resting and squeeze pressures.  Paradoxical contraction of the anal sphincter with poor intrarectal pressure generation during attempted defecation.  Rectoanal inhibitory reflex present.  Abnormal rectal sensation with a slightly elevated volume for first sensation and urge.  The impressions were weak internal and external anal sphincter.  Dyssynergia defecation.  Referral to pelvic floor physical therapy to improve anal sphincter and pelvic floor dysfunction recommended.  Started physical therapy weekly last week. Trying to eat lots of fruits and vegetables.  Too soon to know if there has been improvement. Continues to have an intermittent lack of awareness of the need to defecate. Fecal leaking during sex. Frequent soiling accidents. Some nocturnal symptoms. Size of BM fluctuates between small stain, moderate amount but not a full bowel movement or a full bowel movement. Solid, liquid, and gas. Occassional fecal urgency when the consistency is more solid. When more liquid or gas it is hard to control.       Prior endoscopy: - Colonoscopy in [place], [place] through [facility] showed a normal colonoscopy 02/13/2014.  However the gastroenterologist noted that air insufflation was difficult in the proximal colon limiting complete visualization and a repeat exam was recommended in 3 years.  Random colon biopsies  were obtained because the folds appeared possibly edematous.  - Colonoscopy 08/07/18: one tubular adenoma, normal colon biopsies  History of heavy alcohol - wine. In the past few years the drinking has gotten much worse. Frequently relocating. 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  Significant stress and she wonders if this might be related. Four kids and multiple stressors including her 40 year old daughter who has not been making good decisions.    Quit drinking alcohol after her recent hospitalization.   Past Medical History:  Diagnosis Date  . Alcoholic hepatitis 7/34/1937  . Delirium tremens ([place]) 10/17/2017   Acute alcohol and benzo w/d: 08/2017  . Depression   . GERD (gastroesophageal reflux disease)   . Heart murmur   . Hyperlipidemia   . Hypertension   . Osteopenia   . Tubular adenoma of colon 08/24/2018   Colonoscopy 07/2018, Dr. Laurier Nancy, repeat in 7 years.   . Vision abnormalities     Past Surgical History:  Procedure Laterality Date  . ANAL RECTAL MANOMETRY N/A 11/26/2018   Procedure: ANO RECTAL MANOMETRY;  Surgeon: Thornton Park, MD;  Location: [facility];  Service: Gastroenterology;  Laterality: N/A;  . BACK SURGERY  2008,2017,2019   x 3, electrical implant and explant and attempt at insertion of pain pump  . BUNIONECTOMY  2003  . COLONOSCOPY    . MANDIBLE SURGERY  2005   repaired for proper function of mandible  . POSTERIOR LUMBAR VERTEBRAE EXCISION  09/15/2017   spacers put in L-4- L5 or 6 per pt statement  . TOTAL SHOULDER REPLACEMENT Left 2015    Current Outpatient Medications  Medication Sig Dispense Refill  . atorvastatin (LIPITOR) 20 MG tablet Take 1 tablet (20 mg total) by mouth at bedtime. 90 tablet 3  . DULoxetine (CYMBALTA) 60 MG capsule Take 1 capsule (60 mg total) by mouth daily. 90 capsule 3  . fentaNYL (DURAGESIC) 75 MCG/HR Place 1 patch onto the skin every 3 (three) days. 5 patch 0  . fluticasone (FLONASE) 50 MCG/ACT  nasal spray Place 1 spray into both nostrils as needed.     . folic acid (FOLVITE) 1 MG tablet Take by mouth.    . losartan (COZAAR) 100 MG tablet TAKE 1 TABLET BY MOUTH ONCE DAILY 90 tablet 1  . mirabegron ER (MYRBETRIQ) 25 MG TB24 tablet Take 25 mg by mouth daily.    . Multiple Vitamin (MULTIVITAMIN) capsule Take by mouth daily.     . naloxone (NARCAN) nasal spray 4 mg/0.1 mL Spray into one nostril for suspected overdose. Repeat x1, if no response after 3 minutes.  Dispense one package with 2 nasal sprays.    Marland Kitchen oxycodone (ROXICODONE) 30 MG immediate release tablet Take 60 mg by mouth 4 (four) times daily.     . pantoprazole (PROTONIX) 40 MG tablet Take 1 tablet (40 mg total) by mouth 2 (two) times daily. 180 tablet 3  . sodium chloride 1 g tablet Take 1 tablet (1 g total) by mouth daily. 90 tablet 1  . thiamine 100 MG tablet Take by mouth.    . traZODone (DESYREL) 50 MG tablet Take 50-100 mg by mouth at bedtime.    . vitamin B-12 (CYANOCOBALAMIN) 500 MCG tablet Take 500 mcg by mouth daily.     No current facility-administered medications for this  visit.     Allergies as of 12/31/2018 - Review Complete 12/19/2018  Allergen Reaction Noted  . Bupropion  07/16/2018  . Gabapentin Other (See Comments) 06/29/2015    Family History  Problem Relation Age of Onset  . Hypertension Mother   . Hypertension Father   . Hyperlipidemia Father   . Colon cancer Sister        ? or stomach  . Alcohol abuse Brother   . Hypertension Brother   . Healthy Daughter   . Healthy Son   . Healthy Daughter   . Healthy Daughter   . Colon polyps Neg Hx   . Esophageal cancer Neg Hx   . Rectal cancer Neg Hx   . Stomach cancer Neg Hx     Social History   Socioeconomic History  . Marital status: Married    Spouse name: Not on file  . Number of children: Not on file  . Years of education: Not on file  . Highest education level: Not on file  Occupational History  . Not on file  Social Needs  .  Financial resource strain: Not on file  . Food insecurity    Worry: Not on file    Inability: Not on file  . Transportation needs    Medical: Not on file    Non-medical: Not on file  Tobacco Use  . Smoking status: Former Research scientist (life sciences)  . Smokeless tobacco: Never Used  Substance and Sexual Activity  . Alcohol use: Not Currently  . Drug use: Yes    Types: Fentanyl, Oxycodone  . Sexual activity: Yes  Lifestyle  . Physical activity    Days per week: Not on file    Minutes per session: Not on file  . Stress: Not on file  Relationships  . Social Herbalist on phone: Not on file    Gets together: Not on file    Attends religious service: Not on file    Active member of club or organization: Not on file    Attends meetings of clubs or organizations: Not on file    Relationship status: Not on file  . Intimate partner violence    Fear of current or ex partner: Not on file    Emotionally abused: Not on file    Physically abused: Not on file    Forced sexual activity: Not on file  Other Topics Concern  . Not on file  Social History Narrative  . Not on file    Review of Systems: 12 system ROS is negative except as noted above.  There were no vitals filed for this visit.  Physical Exam: Vital signs were reviewed. General:   Alert, well-nourished, pleasant and cooperative in NAD Head:  Normocephalic and atraumatic. Eyes:  Sclera clear, no icterus.   Conjunctiva pink. Mouth:  No deformity or lesions.   Neck:  Supple; no thyromegaly. Lungs:  Clear throughout to auscultation.   No wheezes.  Heart:  Regular rate and rhythm; no murmurs Abdomen:  Soft, nontender, normal bowel sounds. No rebound or guarding. No hepatosplenomegaly Rectal:  Deferred  Msk:  Symmetrical without gross deformities. Extremities:  No gross deformities or edema. Neurologic:  Alert and  oriented x4;  grossly nonfocal Skin:  No rash or bruise. Psych:  Alert and cooperative. Normal mood and affect.    Aerica Rincon L. Tarri Glenn, MD, MPH [facility] 12/31/2018, 2:45 PM

## 2019-01-01 ENCOUNTER — Ambulatory Visit: Payer: Medicare Other | Admitting: Neurology

## 2019-01-01 ENCOUNTER — Encounter: Payer: Self-pay | Admitting: Gastroenterology

## 2019-01-01 ENCOUNTER — Encounter: Payer: Self-pay | Admitting: Neurology

## 2019-01-03 ENCOUNTER — Encounter: Payer: Self-pay | Admitting: Physical Therapy

## 2019-01-03 ENCOUNTER — Other Ambulatory Visit: Payer: Self-pay

## 2019-01-03 ENCOUNTER — Ambulatory Visit: Payer: Medicare Other | Attending: Gastroenterology | Admitting: Physical Therapy

## 2019-01-03 DIAGNOSIS — M6281 Muscle weakness (generalized): Secondary | ICD-10-CM

## 2019-01-03 DIAGNOSIS — R278 Other lack of coordination: Secondary | ICD-10-CM | POA: Diagnosis not present

## 2019-01-03 DIAGNOSIS — R159 Full incontinence of feces: Secondary | ICD-10-CM

## 2019-01-03 DIAGNOSIS — R32 Unspecified urinary incontinence: Secondary | ICD-10-CM | POA: Diagnosis not present

## 2019-01-03 NOTE — Patient Instructions (Addendum)
Toileting Techniques for Bowel Movements (Defecation) Using your belly (abdomen) and pelvic floor muscles to have a bowel movement is usually instinctive.  Sometimes people can have problems with these muscles and have to relearn proper defecation (emptying) techniques.  If you have weakness in your muscles, organs that are falling out, decreased sensation in your pelvis, or ignore your urge to go, you may find yourself straining to have a bowel movement.  You are straining if you are: . holding your breath or taking in a huge gulp of air and holding it  . keeping your lips and jaw tensed and closed tightly . turning red in the face because of excessive pushing or forcing . developing or worsening your  hemorrhoids . getting faint while pushing . not emptying completely and have to defecate many times a day  If you are straining, you are actually making it harder for yourself to have a bowel movement.  Many people find they are pulling up with the pelvic floor muscles and closing off instead of opening the anus. Due to lack pelvic floor relaxation and coordination the abdominal muscles, one has to work harder to push the feces out.  Many people have never been taught how to defecate efficiently and effectively.  Notice what happens to your body when you are having a bowel movement.  While you are sitting on the toilet pay attention to the following areas: . Jaw and mouth position . Angle of your hips   . Whether your feet touch the ground or not . Arm placement  . Spine position . Waist . Belly tension . Anus (opening of the anal canal)  An Evacuation/Defecation Plan   Here are the 4 basic points:  1. Lean forward enough for your elbows to rest on your knees 2. Support your feet on the floor or use a low stool if your feet don't touch the floor  3. Push out your belly as if you have swallowed a beach ball-you should feel a widening of your waist 4. Open and relax your pelvic floor muscles,  rather than tightening around the anus       The following conditions my require modifications to your toileting posture:  . If you have had surgery in the past that limits your back, hip, pelvic, knee or ankle flexibility . Constipation   Your healthcare practitioner may make the following additional suggestions and adjustments:  1) Sit on the toilet  a) Make sure your feet are supported. b) Notice your hip angle and spine position-most people find it effective to lean forward or raise their knees, which can help the muscles around the anus to relax  c) When you lean forward, place your forearms on your thighs for support  2) Relax suggestions a) Breath deeply in through your nose and out slowly through your mouth as if you are smelling the flowers and blowing out the candles. b) To become aware of how to relax your muscles, contracting and releasing muscles can be helpful.  Pull your pelvic floor muscles in tightly by using the image of holding back gas, or closing around the anus (visualize making a circle smaller) and lifting the anus up and in.  Then release the muscles and your anus should drop down and feel open. Repeat 5 times ending with the feeling of relaxation. c) Keep your pelvic floor muscles relaxed; let your belly bulge out. d) The digestive tract starts at the mouth and ends at the anal opening, so   be sure to relax both ends of the tube.  Place your tongue on the roof of your mouth with your teeth separated.  This helps relax your mouth and will help to relax the anus at the same time.  3) Empty (defecation) a) Keep your pelvic floor and sphincter relaxed, then bulge your anal muscles.  Make the anal opening wide.  b) Stick your belly out as if you have swallowed a beach ball. c) Make your belly wall hard using your belly muscles while continuing to breathe. Doing this makes it easier to open your anus. d) Breath out and give a grunt (or try using other sounds  such as ahhhh, shhhhh, ohhhh or grrrrrrr).  4) Finish a) As you finish your bowel movement, pull the pelvic floor muscles up and in.  This will leave your anus in the proper place rather than remaining pushed out and down. If you leave your anus pushed out and down, it will start to feel as though that is normal and give you incorrect signals about needing to have a bowel movement.  About Abdominal Massage  Abdominal massage, also called external colon massage, is a self-treatment circular massage technique that can reduce and eliminate gas and ease constipation. The colon naturally contracts in waves in a clockwise direction starting from inside the right hip, moving up toward the ribs, across the belly, and down inside the left hip.  When you perform circular abdominal massage, you help stimulate your colon's normal wave pattern of movement called peristalsis.  It is most beneficial when done after eating.  Positioning You can practice abdominal massage with oil while lying down, or in the shower with soap.  Some people find that it is just as effective to do the massage through clothing while sitting or standing.  How to Massage Start by placing your finger tips or knuckles on your right side, just inside your hip bone.  . Make small circular movements while you move upward toward your rib cage.   . Once you reach the bottom right side of your rib cage, take your circular movements across to the left side of the bottom of your rib cage.  . Next, move downward until you reach the inside of your left hip bone.  This is the path your feces travel in your colon. . Continue to perform your abdominal massage in this pattern for 10 minutes each day.     You can apply as much pressure as is comfortable in your massage.  Start gently and build pressure as you continue to practice.  Notice any areas of pain as you massage; areas of slight pain may be relieved as you massage, but if you have areas of  significant or intense pain, consult with your healthcare provider.  Other Considerations . General physical activity including bending and stretching can have a beneficial massage-like effect on the colon.  Deep breathing can also stimulate the colon because breathing deeply activates the same nervous system that supplies the colon.   . Abdominal massage should always be used in combination with a bowel-conscious diet that is high in the proper type of fiber for you, fluids (primarily water), and a regular exercise program. [facility] [address], [address] [place], [place] [address] Phone # [phone] Fax [phone]  

## 2019-01-03 NOTE — Therapy (Signed)
[facility] [address] [place], [place], [address] Phone: [phone]   Fax:  [phone]  Physical Therapy Treatment  Patient Details  Name: Shannon Young MRN: [id number] Date of Birth: 11/19/1955 Referring Provider (PT): Dr. Thornton Park   Encounter Date: 01/03/2019  PT End of Session - 01/03/19 1453    Visit Number  2    Date for PT Re-Evaluation  02/13/19    Authorization Type  Medicare    PT Start Time  1443   came late and had to go to the bathroom   PT Stop Time  1525    PT Time Calculation (min)  31 min    Activity Tolerance  Patient tolerated treatment well;No increased pain    Behavior During Therapy  WFL for tasks assessed/performed       Past Medical History:  Diagnosis Date  . Alcoholic hepatitis 1/54/0086  . Delirium tremens ([place]) 10/17/2017   Acute alcohol and benzo w/d: 08/2017  . Depression   . GERD (gastroesophageal reflux disease)   . Heart murmur   . Hyperlipidemia   . Hypertension   . Osteopenia   . Tubular adenoma of colon 08/24/2018   Colonoscopy 07/2018, Dr. Laurier Nancy, repeat in 7 years.   . Vision abnormalities     Past Surgical History:  Procedure Laterality Date  . ANAL RECTAL MANOMETRY N/A 11/26/2018   Procedure: ANO RECTAL MANOMETRY;  Surgeon: Thornton Park, MD;  Location: [facility];  Service: Gastroenterology;  Laterality: N/A;  . BACK SURGERY  2008,2017,2019   x 3, electrical implant and explant and attempt at insertion of pain pump  . BUNIONECTOMY  2003  . COLONOSCOPY    . MANDIBLE SURGERY  2005   repaired for proper function of mandible  . POSTERIOR LUMBAR VERTEBRAE EXCISION  09/15/2017   spacers put in L-4- L5 or 6 per pt statement  . TOTAL SHOULDER REPLACEMENT Left 2015    There were no vitals filed for this visit.  Subjective Assessment - 01/03/19 1455    Subjective  I felt fine after the evaluation. I have been exercising. I am noticing the stool loss  more than the eval. The stool loss is getting better. I need to try the gummy fibers instead of the psyllium. I am drinking hot coffee in the morning.    Diagnostic tests  x-ray shows a screw is broken in back, patient has a PT order for her back but has not scheduled an eval    Patient Stated Goals  more control over rectal muscles, work on back pain    Currently in Pain?  No/denies                       [facility] Adult PT Treatment/Exercise - 01/03/19 0001      Therapeutic Activites    Therapeutic Activities  Other Therapeutic Activities    Other Therapeutic Activities  instruction on correct toileting techinque to relax the pelvic floor and contract the lower abdomen using  Grr sound      Neuro Re-ed    Neuro Re-ed Details   diaphragmatic breathing to expand her belly to move the diaphragm and pelvic floor together and set up for toilleting      Manual Therapy   Manual Therapy  Soft tissue mobilization;Myofascial release    Manual therapy comments  education on self abdominal massage    Soft tissue mobilization  circular massage to abdominal to promote peristalic  massage, soft tissue work to the diaphragm    Myofascial Release  along the upper abdominal area; release of the left lower abdominals             PT Education - 01/03/19 1529    Education Details  toileting technique, abdominal massage    Person(s) Educated  Patient    Methods  Explanation;Demonstration;Handout    Comprehension  Returned demonstration;Verbalized understanding       PT Short Term Goals - 12/19/18 1426      PT SHORT TERM GOAL #1   Title  independent with initial HEP    Time  4    Period  Weeks    Status  New    Target Date  01/16/19      PT SHORT TERM GOAL #2   Title  able to contract the rectum with circular contraction and lift and puborectalis coming forward    Time  4    Period  Weeks    Status  New    Target Date  01/16/19      PT SHORT TERM GOAL #3   Title  understand  how caffiene and sodas affect the bladder and why she should drink more water    Time  4    Period  Weeks    Status  New    Target Date  01/16/19      PT SHORT TERM GOAL #4   Title  fecal leakage decreased >/= 25% due to increased muscle control    Time  4    Period  Weeks    Status  New    Target Date  01/16/19        PT Long Term Goals - 12/19/18 1428      PT LONG TERM GOAL #1   Title  independent with HEP and understand how to progress herself    Time  8    Period  Weeks    Status  New    Target Date  02/13/19      PT LONG TERM GOAL #2   Title  fecal leakage improved >/= 75% due to improved sphincter tone and strength >/= 3/5    Time  8    Period  Weeks    Status  New    Target Date  02/13/19      PT LONG TERM GOAL #3   Title  urinary leakage decreased >/= 50% due to improved pelvic floor strength    Time  8    Period  Weeks    Status  New    Target Date  02/13/19      PT LONG TERM GOAL #4   Title  improved feeling of the anal sphincter when stool is coming >/= 50% out due to improve tone    Time  8    Period  Weeks    Status  New    Target Date  02/13/19      PT LONG TERM GOAL #5   Title  able to fully evacuate the stool due to improve tone of the sphincter    Time  8    Period  Weeks    Status  New    Target Date  02/13/19            Plan - 01/03/19 1531    Clinical Impression Statement  Patient feels her leakage has reduced and she is feel the leakage more due to increased sensory awareness. Patient has  learned abdominal massage to improve peristalic motion of the intestines. Patient has difficulty with diaphragmatic breathing due to wanting to place her air in her chest and increased tightness of the diaphgram. Patient will benefit from skilled therapy to improve pelvic floor muscle coordination and strength to reduce leakage.    Personal Factors and Comorbidities  Fitness;Comorbidity 1;Comorbidity 2;Comorbidity 3+    Comorbidities  major  depression, 5 back surgeries to lumbar, adenoma of colon    Examination-Activity Limitations  Continence;Stand;Toileting;Sit    Examination-Participation Restrictions  Interpersonal Relationship    Stability/Clinical Decision Making  Evolving/Moderate complexity    Rehab Potential  Good    PT Frequency  1x / week    PT Duration  8 weeks    PT Treatment/Interventions  Biofeedback;Therapeutic activities;Therapeutic exercise;Patient/family education;Neuromuscular re-education;Manual techniques    PT Next Visit Plan  work on fascial restrictions to the anus, work on anal contraction and bringing puborectalis forward    Recommended Other Services  MD signed intial eval    Consulted and Agree with Plan of Care  Patient       Patient will benefit from skilled therapeutic intervention in order to improve the following deficits and impairments:  Decreased coordination, Increased fascial restricitons, Decreased endurance, Increased muscle spasms, Pain, Decreased strength  Visit Diagnosis: 1. Muscle weakness (generalized)   2. Other lack of coordination   3. Incontinence of feces, unspecified fecal incontinence type   4. Urinary incontinence, unspecified type        Problem List Patient Active Problem List   Diagnosis Date Noted  . Incontinence of feces   . Tubular adenoma of colon 08/24/2018  . Recurrent major depressive disorder, in partial remission ([place]) 06/15/2018  . Urinary incontinence 02/27/2018  . Paresthesia 02/27/2018  . Memory loss 11/23/2017  . Major depression, recurrent, chronic ([place]) 10/17/2017  . Alcohol dependence in remission ([place]) 10/17/2017  . Insomnia secondary to chronic pain 10/17/2017  . Spinal stenosis of lumbar region 10/17/2017  . Alcoholic hepatitis 90/24/0973  . Delirium tremens ([place]) 10/17/2017  . Essential hypertension 06/29/2015  . Family history of ischemic heart disease (IHD) 06/29/2015  . Nonrheumatic mitral valve regurgitation 06/29/2015  .  Orthostatic hypotension 06/29/2015  . GERD (gastroesophageal reflux disease) 05/10/2013  . Chronic pain syndrome 06/01/2010  . Opioid dependence ([place]) 06/01/2010  . Postlaminectomy syndrome, not elsewhere classified 06/01/2010  . DDD (degenerative disc disease), lumbosacral 03/19/2010    Earlie Counts, PT 01/03/19 5:20 PM   [facility] [address] [place], [place], [address] Phone: [phone]   Fax:  [phone]  Name: Vannia Pola MRN: [id number] Date of Birth: 1955-06-09

## 2019-01-07 ENCOUNTER — Ambulatory Visit
Admission: RE | Admit: 2019-01-07 | Discharge: 2019-01-07 | Disposition: A | Discharge status: Home / Self Care | Payer: Medicare Other | Source: Ambulatory Visit | Attending: Orthopaedic Surgery | Admitting: Orthopaedic Surgery

## 2019-01-07 ENCOUNTER — Encounter: Payer: Self-pay | Admitting: Family Medicine

## 2019-01-07 ENCOUNTER — Ambulatory Visit (INDEPENDENT_AMBULATORY_CARE_PROVIDER_SITE_OTHER): Payer: Medicare Other | Admitting: Family Medicine

## 2019-01-07 ENCOUNTER — Other Ambulatory Visit: Payer: Self-pay

## 2019-01-07 VITALS — BP 132/82 | HR 77 | Temp 97.9°F | Resp 16 | Ht 65.25 in | Wt 153.8 lb

## 2019-01-07 DIAGNOSIS — G894 Chronic pain syndrome: Secondary | ICD-10-CM

## 2019-01-07 DIAGNOSIS — F3341 Major depressive disorder, recurrent, in partial remission: Secondary | ICD-10-CM | POA: Diagnosis not present

## 2019-01-07 DIAGNOSIS — E222 Syndrome of inappropriate secretion of antidiuretic hormone: Secondary | ICD-10-CM

## 2019-01-07 DIAGNOSIS — IMO0001 Reserved for inherently not codable concepts without codable children: Secondary | ICD-10-CM

## 2019-01-07 DIAGNOSIS — I10 Essential (primary) hypertension: Secondary | ICD-10-CM | POA: Diagnosis not present

## 2019-01-07 DIAGNOSIS — M48061 Spinal stenosis, lumbar region without neurogenic claudication: Secondary | ICD-10-CM | POA: Diagnosis not present

## 2019-01-07 DIAGNOSIS — G8929 Other chronic pain: Secondary | ICD-10-CM

## 2019-01-07 DIAGNOSIS — E782 Mixed hyperlipidemia: Secondary | ICD-10-CM

## 2019-01-07 DIAGNOSIS — M5137 Other intervertebral disc degeneration, lumbosacral region: Secondary | ICD-10-CM | POA: Diagnosis not present

## 2019-01-07 DIAGNOSIS — G4701 Insomnia due to medical condition: Secondary | ICD-10-CM

## 2019-01-07 DIAGNOSIS — Z1239 Encounter for other screening for malignant neoplasm of breast: Secondary | ICD-10-CM

## 2019-01-07 DIAGNOSIS — F339 Major depressive disorder, recurrent, unspecified: Secondary | ICD-10-CM | POA: Diagnosis not present

## 2019-01-07 DIAGNOSIS — M545 Low back pain: Secondary | ICD-10-CM | POA: Diagnosis not present

## 2019-01-07 NOTE — Patient Instructions (Signed)
Please return in 6 months for follow up of your hypertension. Please schedule an appointment for your pap smear that is due at your convenience.   I've ordered a mammogram to be scheduled for you.   If you have any questions or concerns, please don't hesitate to send me a message via MyChart or call the office at [phone]. Thank you for visiting with [place] today! It's our pleasure caring for you.

## 2019-01-07 NOTE — Progress Notes (Signed)
Subjective  Chief Complaint  Patient presents with  . Annual Exam    Not fasting  . Depression  . Hypertension  . Hyperlipidemia  . Osteoarthritis  . Insomnia    HPI: Shannon Young is a 63 y.o. female who presents to [facility] today for a Female Wellness Visit. She also has the concerns and/or needs as listed above in the chief complaint. These will be addressed in addition to the Health Maintenance Visit.   Wellness Visit: annual visit with health maintenance review and exam without Pap   HM: due pap and mammo. Pt defers pap today.  Chronic disease f/u and/or acute problem visit: (deemed necessary to be done in addition to the wellness visit):  Chronic problems: remain the same: still with chronic pain managed by specialist on chronic narcotics w/o good results. Still undergoing eval with neuro due to memory concerns. Seeing psych for depression and meds being adjusted. Poor sleep. H/o hyponatremia due to siadh last checked in march. Neg cxr.   No new concerns.   Assessment  1. Essential hypertension   2. Chronic pain syndrome   3. DDD (degenerative disc disease), lumbosacral   4. Insomnia secondary to chronic pain   5. Recurrent major depressive disorder, in partial remission ([place])   6. Major depression, recurrent, chronic ([place])   7. SIADH (syndrome of inappropriate ADH production) ([place])   8. Mixed hyperlipidemia      Plan  Female Wellness Visit:  Age appropriate Health Maintenance and Prevention measures were discussed with patient. Included topics are cancer screening recommendations, ways to keep healthy (see AVS) including dietary and exercise recommendations, regular eye and dental care, use of seat belts, and avoidance of moderate alcohol use and tobacco use.   BMI: discussed patient's BMI and encouraged positive lifestyle modifications to help get to or maintain a target BMI.  HM needs and immunizations were addressed and ordered. See  below for orders. See HM and immunization section for updates.  Routine labs and screening tests ordered including cmp, cbc and lipids where appropriate.  Discussed recommendations regarding Vit D and calcium supplementation (see AVS)  Chronic disease management visit and/or acute problem visit:  Chronic problems remain active and difficult to control. No new recommendations.   Check lipids, renal, lytes, cbc and tsh.   No med changes today.  Pt will return for pap  mammo ordered.  Follow up: No follow-ups on file.  Orders Placed This Encounter  Procedures  . Comprehensive metabolic panel  . CBC with Differential/Platelet  . Lipid panel  . TSH   No orders of the defined types were placed in this encounter.     Lifestyle: Body mass index is 25.79 kg/m. Wt Readings from Last 3 Encounters:  01/07/19 153 lb 12.8 oz (69.8 kg)  12/31/18 157 lb 4 oz (71.3 kg)  08/07/18 159 lb (72.1 kg)     Patient Active Problem List   Diagnosis Date Noted  . Recurrent major depressive disorder, in partial remission ([place]) 06/15/2018    Priority: High  . Alcohol dependence in remission ([place]) 10/17/2017    Priority: High  . Insomnia secondary to chronic pain 10/17/2017    Priority: High  . Spinal stenosis of lumbar region 10/17/2017    Priority: High  . Alcoholic hepatitis 97/98/9211    Priority: High    Ultrasound 08/2017   . Essential hypertension 06/29/2015    Priority: High  . Chronic pain syndrome 06/01/2010    Priority:  High  . Opioid dependence ([place]) 06/01/2010    Priority: High  . Postlaminectomy syndrome, not elsewhere classified 06/01/2010    Priority: High  . DDD (degenerative disc disease), lumbosacral 03/19/2010    Priority: High  . Family history of ischemic heart disease (IHD) 06/29/2015    Priority: Medium  . GERD (gastroesophageal reflux disease) 05/10/2013    Priority: Medium  . Nonrheumatic mitral valve regurgitation 06/29/2015    Priority: Low  .  Incontinence of feces   . Tubular adenoma of colon 08/24/2018    Colonoscopy 07/2018, Dr. Laurier Nancy, repeat in 7 years.    . Urinary incontinence 02/27/2018  . Paresthesia 02/27/2018  . Memory loss 11/23/2017  . Major depression, recurrent, chronic ([place]) 10/17/2017    Zoloft, prozac, and 3-4 others. None were helpful but were in the setting of active alcoholism.    . Delirium tremens ([place]) 10/17/2017    Acute alcohol and benzo w/d: 08/2017   . Orthostatic hypotension 06/29/2015   Health Maintenance  Topic Date Due  . MAMMOGRAM  09/04/1975  . PAP SMEAR-Modifier  09/04/1978  . INFLUENZA VACCINE  12/29/2018  . TETANUS/TDAP  05/30/2025  . COLONOSCOPY  08/06/2025  . Hepatitis C Screening  Completed  . HIV Screening  Completed   Immunization History  Administered Date(s) Administered  . Influenza Split 02/27/2010  . Influenza, High Dose Seasonal PF 01/26/2018  . Tdap 05/31/2015   We updated and reviewed the patient's past history in detail and it is documented below. Allergies: Patient is allergic to bupropion and gabapentin. Past Medical History Patient  has a past medical history of Alcoholic hepatitis (12/09/4578), Delirium tremens ([place]) (10/17/2017), Depression, GERD (gastroesophageal reflux disease), Heart murmur, Hyperlipidemia, Hypertension, Osteopenia, Tubular adenoma of colon (08/24/2018), and Vision abnormalities. Past Surgical History Patient  has a past surgical history that includes Back surgery (2008,2017,2019); Total shoulder replacement (Left, 2015); Bunionectomy (2003); Mandible surgery (2005); Posterior lumbar vertebrae excision (09/15/2017); Colonoscopy; and Anal Rectal manometry (N/A, 11/26/2018). Family History: Patient family history includes Alcohol abuse in her brother; Colon cancer in her sister; Healthy in her daughter, daughter, daughter, and son; Hyperlipidemia in her father; Hypertension in her brother, father, and mother. Social History:  Patient  reports  that she has quit smoking. She has never used smokeless tobacco. She reports previous alcohol use. She reports current drug use. Drugs: Fentanyl and Oxycodone.  Review of Systems: Constitutional: negative for fever or malaise Ophthalmic: negative for photophobia, double vision or loss of vision Cardiovascular: negative for chest pain, dyspnea on exertion, or new LE swelling Respiratory: negative for SOB or persistent cough Gastrointestinal: negative for abdominal pain, change in bowel habits or melena Genitourinary: negative for dysuria or gross hematuria, no abnormal uterine bleeding or disharge Musculoskeletal: negative for new gait disturbance or muscular weakness Integumentary: negative for new or persistent rashes, no breast lumps Neurological: negative for TIA or stroke symptoms Psychiatric: negative for SI or delusions Allergic/Immunologic: negative for hives  Patient Care Team    Relationship Specialty Notifications Start End  Leamon Arnt, MD PCP - General Family Medicine  10/17/17   Marcial Pacas, MD Consulting Physician Neurology  11/29/17   Thornton Park, MD Consulting Physician Gastroenterology  08/24/18     Objective  Vitals: BP 132/82   Pulse 77   Temp 97.9 F (36.6 C) (Tympanic)   Resp 16   Wt 153 lb 12.8 oz (69.8 kg)   SpO2 97%   BMI 25.79 kg/m  General:  Well developed, well nourished,  no acute distress  Psych:  Alert and orientedx3,normal mood and affect HEENT:  Normocephalic, atraumatic, non-icteric sclera, PERRL, oropharynx is clear without mass or exudate, supple neck without adenopathy, mass or thyromegaly Cardiovascular:  Normal S1, S2, RRR without gallop, rub or murmur, nondisplaced PMI Respiratory:  Good breath sounds bilaterally, CTAB with normal respiratory effort Gastrointestinal: normal bowel sounds, soft, non-tender, no noted masses. No HSM MSK: no deformities, contusions. Joints are without erythema or swelling. Spine and CVA region are nontender  Skin:  Warm, no rashes or suspicious lesions noted Neurologic:    Mental status is normal.     Commons side effects, risks, benefits, and alternatives for medications and treatment plan prescribed today were discussed, and the patient expressed understanding of the given instructions. Patient is instructed to call or message via MyChart if he/she has any questions or concerns regarding our treatment plan. No barriers to understanding were identified. We discussed Red Flag symptoms and signs in detail. Patient expressed understanding regarding what to do in case of urgent or emergency type symptoms.   Medication list was reconciled, printed and provided to the patient in AVS. Patient instructions and summary information was reviewed with the patient as documented in the AVS. This note was prepared with assistance of Dragon voice recognition software. Occasional wrong-word or sound-a-like substitutions may have occurred due to the inherent limitations of voice recognition software

## 2019-01-09 ENCOUNTER — Other Ambulatory Visit: Payer: Medicare Other

## 2019-01-09 DIAGNOSIS — M5440 Lumbago with sciatica, unspecified side: Secondary | ICD-10-CM | POA: Diagnosis not present

## 2019-01-10 ENCOUNTER — Encounter: Payer: Self-pay | Admitting: Physical Therapy

## 2019-01-10 ENCOUNTER — Ambulatory Visit: Payer: Medicare Other | Admitting: Physical Therapy

## 2019-01-10 ENCOUNTER — Other Ambulatory Visit: Payer: Self-pay

## 2019-01-10 DIAGNOSIS — R32 Unspecified urinary incontinence: Secondary | ICD-10-CM | POA: Diagnosis not present

## 2019-01-10 DIAGNOSIS — R278 Other lack of coordination: Secondary | ICD-10-CM

## 2019-01-10 DIAGNOSIS — M6281 Muscle weakness (generalized): Secondary | ICD-10-CM

## 2019-01-10 DIAGNOSIS — R159 Full incontinence of feces: Secondary | ICD-10-CM | POA: Diagnosis not present

## 2019-01-10 NOTE — Patient Instructions (Signed)
Access Code: [email or web address]  URL: [email or web address]  Date: 01/10/2019  Prepared by: Earlie Counts   Exercises Sidelying Hip Adduction - 10 reps - 1 sets - 1x daily - 7x weekly Clamshell - 10 reps - 1 sets - 1x daily - 7x weekly Seated Pelvic Floor Contraction - 5 reps - 1 sets - 5 sec hold - 7x weekly [facility] [address] [place], [place] [address] Phone # [phone] Fax [phone]

## 2019-01-10 NOTE — Therapy (Signed)
[facility] [address] [place], [place], [address] Phone: [phone]   Fax:  [phone]  Physical Therapy Treatment  Patient Details  Name: Shannon Young MRN: [id number] Date of Birth: [birth year] Referring Provider (PT): Dr. Thornton Park   Encounter Date: 01/10/2019  PT End of Session - 01/10/19 1539    Visit Number  3    Date for PT Re-Evaluation  02/13/19    Authorization Type  Medicare    PT Start Time  1443   had to go to the bathroom   PT Stop Time  1615    PT Time Calculation (min)  39 min    Activity Tolerance  Patient tolerated treatment well;No increased pain    Behavior During Therapy  WFL for tasks assessed/performed       Past Medical History:  Diagnosis Date  . Alcoholic hepatitis 1/54/0086  . Delirium tremens ([place]) 10/17/2017   Acute alcohol and benzo w/d: 08/2017  . Depression   . GERD (gastroesophageal reflux disease)   . Heart murmur   . Hyperlipidemia   . Hypertension   . Osteopenia   . Tubular adenoma of colon 08/24/2018   Colonoscopy 07/2018, Dr. Laurier Nancy, repeat in 7 years.   . Vision abnormalities     Past Surgical History:  Procedure Laterality Date  . ANAL RECTAL MANOMETRY N/A 11/26/2018   Procedure: ANO RECTAL MANOMETRY;  Surgeon: Thornton Park, MD;  Location: [facility];  Service: Gastroenterology;  Laterality: N/A;  . BACK SURGERY  2008,2017,2019   x 3, electrical implant and explant and attempt at insertion of pain pump  . BUNIONECTOMY  2003  . COLONOSCOPY    . MANDIBLE SURGERY  2005   repaired for proper function of mandible  . POSTERIOR LUMBAR VERTEBRAE EXCISION  09/15/2017   spacers put in L-4- L5 or 6 per pt statement  . TOTAL SHOULDER REPLACEMENT Left 2015    There were no vitals filed for this visit.  Subjective Assessment - 01/10/19 1549    Subjective  I am down to 6 sodas per day. I had 1 fecal leakage since the last visit.  Urinary leakage is 20%  better.    Diagnostic tests  x-ray shows a screw is broken in back, patient has a PT order for her back but has not scheduled an eval    Patient Stated Goals  more control over rectal muscles, work on back pain    Currently in Pain?  No/denies                    Pelvic Floor Special Questions - 01/10/19 0001    Pelvic Floor Internal Exam  Patient confirms identification and approves PT to assess pelvic floor strength and treatment    Exam Type  Rectal    Palpation  vaginal strength is 2/5    Strength  fair squeeze, definite lift        [facility] Adult PT Treatment/Exercise - 01/10/19 0001      Neuro Re-ed    Neuro Re-ed Details   pelvic floor contraction in sidely with theraoist giving tactile cues to the anal sphincter and vaginal muscles hold for 10 seconds 10 times each      Lumbar Exercises: Aerobic   Nustep  level 1 for 5 minutes while assessing patient      Knee/Hip Exercises: Sidelying   Hip ADduction  Strengthening;Right;Left;1 set;10 reps   keeping pelvis still   Clams  10 times  each side      Manual Therapy   Manual Therapy  Internal Pelvic Floor    Internal Pelvic Floor  along the bilateral bulbocavernosus             PT Education - 01/10/19 1613    Education Details  Access Code: [email or web address]    Person(s) Educated  Patient    Methods  Explanation;Demonstration;Verbal cues;Handout    Comprehension  Verbalized understanding;Returned demonstration       PT Short Term Goals - 01/10/19 1547      PT SHORT TERM GOAL #1   Title  independent with initial HEP    Time  4    Period  Weeks    Status  Achieved    Target Date  01/16/19      PT SHORT TERM GOAL #3   Title  understand how caffiene and sodas affect the bladder and why she should drink more water    Time  4    Period  Weeks    Status  Achieved        PT Long Term Goals - 12/19/18 1428      PT LONG TERM GOAL #1   Title  independent with HEP and understand how to progress herself     Time  8    Period  Weeks    Status  New    Target Date  02/13/19      PT LONG TERM GOAL #2   Title  fecal leakage improved >/= 75% due to improved sphincter tone and strength >/= 3/5    Time  8    Period  Weeks    Status  New    Target Date  02/13/19      PT LONG TERM GOAL #3   Title  urinary leakage decreased >/= 50% due to improved pelvic floor strength    Time  8    Period  Weeks    Status  New    Target Date  02/13/19      PT LONG TERM GOAL #4   Title  improved feeling of the anal sphincter when stool is coming >/= 50% out due to improve tone    Time  8    Period  Weeks    Status  New    Target Date  02/13/19      PT LONG TERM GOAL #5   Title  able to fully evacuate the stool due to improve tone of the sphincter    Time  8    Period  Weeks    Status  New    Target Date  02/13/19            Plan - 01/10/19 1541    Clinical Impression Statement  Patient rectal strength has increased to 3/5 and vaginal strength is 2/5. Patient reports 1 stool lose from last session. Patient urinary leakage improved by 20%. Patient is dong her HEP. Patient is able to pull her puborectalis forward now with anal contraction. Patient will benefit from skilled therapy to improve pelvic floor muscle coordination and strength ot reduce leakage.    Personal Factors and Comorbidities  Fitness;Comorbidity 1;Comorbidity 2;Comorbidity 3+    Comorbidities  major depression, 5 back surgeries to lumbar, adenoma of colon    Examination-Activity Limitations  Continence;Stand;Toileting;Sit    Examination-Participation Restrictions  Interpersonal Relationship    Stability/Clinical Decision Making  Evolving/Moderate complexity    Rehab Potential  Good    PT Frequency  1x /  week    PT Duration  8 weeks    PT Treatment/Interventions  Biofeedback;Therapeutic activities;Therapeutic exercise;Patient/family education;Neuromuscular re-education;Manual techniques    PT Next Visit Plan  work on core strength  with transverse abdominus, sitting ball squeeze, supine resistive hip flexion, mini squat with spinal neutral    PT Home Exercise Plan  Access Code: [email or web address]    Consulted and Agree with Plan of Care  Patient       Patient will benefit from skilled therapeutic intervention in order to improve the following deficits and impairments:  Decreased coordination, Increased fascial restricitons, Decreased endurance, Increased muscle spasms, Pain, Decreased strength  Visit Diagnosis: 1. Muscle weakness (generalized)   2. Other lack of coordination   3. Incontinence of feces, unspecified fecal incontinence type   4. Urinary incontinence, unspecified type        Problem List Patient Active Problem List   Diagnosis Date Noted  . Incontinence of feces   . Tubular adenoma of colon 08/24/2018  . Recurrent major depressive disorder, in partial remission ([place]) 06/15/2018  . Urinary incontinence 02/27/2018  . Paresthesia 02/27/2018  . Memory loss 11/23/2017  . Major depression, recurrent, chronic ([place]) 10/17/2017  . Alcohol dependence in remission ([place]) 10/17/2017  . Insomnia secondary to chronic pain 10/17/2017  . Spinal stenosis of lumbar region 10/17/2017  . Alcoholic hepatitis 59/56/3875  . Delirium tremens ([place]) 10/17/2017  . Essential hypertension 06/29/2015  . Family history of ischemic heart disease (IHD) 06/29/2015  . Nonrheumatic mitral valve regurgitation 06/29/2015  . Orthostatic hypotension 06/29/2015  . GERD (gastroesophageal reflux disease) 05/10/2013  . Chronic pain syndrome 06/01/2010  . Opioid dependence ([place]) 06/01/2010  . Postlaminectomy syndrome, not elsewhere classified 06/01/2010  . DDD (degenerative disc disease), lumbosacral 03/19/2010    Earlie Counts, PT 01/10/19 4:18 PM   [facility] [address], [place], [place], [address] Phone: [phone]   Fax:  [phone]  Name: Evone Arseneau MRN:  [id number] Date of Birth: 06/13/1955

## 2019-01-11 ENCOUNTER — Telehealth: Payer: Self-pay

## 2019-01-11 ENCOUNTER — Other Ambulatory Visit (INDEPENDENT_AMBULATORY_CARE_PROVIDER_SITE_OTHER): Payer: Medicare Other

## 2019-01-11 ENCOUNTER — Other Ambulatory Visit: Payer: Self-pay

## 2019-01-11 ENCOUNTER — Encounter (HOSPITAL_COMMUNITY): Payer: Self-pay

## 2019-01-11 DIAGNOSIS — Z5321 Procedure and treatment not carried out due to patient leaving prior to being seen by health care provider: Secondary | ICD-10-CM | POA: Insufficient documentation

## 2019-01-11 DIAGNOSIS — I10 Essential (primary) hypertension: Secondary | ICD-10-CM

## 2019-01-11 DIAGNOSIS — G4701 Insomnia due to medical condition: Secondary | ICD-10-CM | POA: Diagnosis not present

## 2019-01-11 DIAGNOSIS — E222 Syndrome of inappropriate secretion of antidiuretic hormone: Secondary | ICD-10-CM

## 2019-01-11 DIAGNOSIS — G8929 Other chronic pain: Secondary | ICD-10-CM | POA: Diagnosis not present

## 2019-01-11 DIAGNOSIS — F339 Major depressive disorder, recurrent, unspecified: Secondary | ICD-10-CM

## 2019-01-11 DIAGNOSIS — R7989 Other specified abnormal findings of blood chemistry: Secondary | ICD-10-CM | POA: Diagnosis not present

## 2019-01-11 LAB — COMPREHENSIVE METABOLIC PANEL
ALT: 16 U/L (ref 0–35)
AST: 20 U/L (ref 0–37)
Albumin: 4.4 g/dL (ref 3.5–5.2)
Alkaline Phosphatase: 84 U/L (ref 39–117)
BUN: 8 mg/dL (ref 6–23)
CO2: 29 mEq/L (ref 19–32)
Calcium: 9.1 mg/dL (ref 8.4–10.5)
Chloride: 83 mEq/L — ABNORMAL LOW (ref 96–112)
Creatinine, Ser: 0.54 mg/dL (ref 0.40–1.20)
GFR: 114.64 mL/min (ref >60.00)
Glucose, Bld: 93 mg/dL (ref 70–99)
Potassium: 4.5 mEq/L (ref 3.5–5.1)
Sodium: 118 mEq/L — CL (ref 135–145)
Total Bilirubin: 0.4 mg/dL (ref 0.2–1.2)
Total Protein: 6.3 g/dL (ref 6.0–8.3)

## 2019-01-11 LAB — LIPID PANEL
Cholesterol: 102 mg/dL (ref 0–200)
HDL: 56.3 mg/dL (ref >39.00)
LDL Cholesterol: 26 mg/dL (ref 0–99)
NonHDL: 45.45
Total CHOL/HDL Ratio: 2
Triglycerides: 95 mg/dL (ref 0.0–149.0)
VLDL: 19 mg/dL (ref 0.0–40.0)

## 2019-01-11 LAB — CBC WITH DIFFERENTIAL/PLATELET
Basophils Absolute: 0.1 10*3/uL (ref 0.0–0.1)
Basophils Relative: 1 % (ref 0.0–3.0)
Eosinophils Absolute: 0.3 10*3/uL (ref 0.0–0.7)
Eosinophils Relative: 5.5 % — ABNORMAL HIGH (ref 0.0–5.0)
HCT: 32.5 % — ABNORMAL LOW (ref 36.0–46.0)
Hemoglobin: 11.3 g/dL — ABNORMAL LOW (ref 12.0–15.0)
Lymphocytes Relative: 36.6 % (ref 12.0–46.0)
Lymphs Abs: 2.3 10*3/uL (ref 0.7–4.0)
MCHC: 34.9 g/dL (ref 30.0–36.0)
MCV: 84.6 fl (ref 78.0–100.0)
Monocytes Absolute: 0.5 10*3/uL (ref 0.1–1.0)
Monocytes Relative: 8.8 % (ref 3.0–12.0)
Neutro Abs: 3 10*3/uL (ref 1.4–7.7)
Neutrophils Relative %: 48.1 % (ref 43.0–77.0)
Platelets: 378 10*3/uL (ref 150.0–400.0)
RBC: 3.84 Mil/uL — ABNORMAL LOW (ref 3.87–5.11)
RDW: 13.6 % (ref 11.5–15.5)
WBC: 6.2 10*3/uL (ref 4.0–10.5)

## 2019-01-11 LAB — TSH: TSH: 0.78 u[IU]/mL (ref 0.35–4.50)

## 2019-01-11 NOTE — Telephone Encounter (Signed)
Received critical lab results of sodium 118. Results reviewed by Dr. Juleen China. Per her recommendation I have called patient and advised to go to ED for evaluation. Answered all questions. She will go now.

## 2019-01-11 NOTE — ED Notes (Addendum)
Call made for pt in ED lobby for vitals reassessment; no answer x1. BD 

## 2019-01-11 NOTE — ED Triage Notes (Signed)
States was on sodium tablets and caused her to swell and quit taking them, and had blood drawn today with sodium 118, alert and oriented x 3 no cramping voiced.

## 2019-01-12 ENCOUNTER — Emergency Department (HOSPITAL_COMMUNITY)
Admission: EM | Admit: 2019-01-12 | Discharge: 2019-01-12 | Disposition: A | Discharge status: Home / Self Care | Payer: Medicare Other | Attending: Emergency Medicine | Admitting: Emergency Medicine

## 2019-01-12 HISTORY — DX: Hypo-osmolality and hyponatremia: E87.1

## 2019-01-12 NOTE — ED Notes (Signed)
Role call for pt status; no answer x3. RN advised. ENMiles  

## 2019-01-12 NOTE — ED Notes (Signed)
No answer x2 for pt for vitals update. ENMiles  

## 2019-01-13 ENCOUNTER — Encounter (HOSPITAL_COMMUNITY): Payer: Self-pay | Admitting: Emergency Medicine

## 2019-01-13 ENCOUNTER — Other Ambulatory Visit: Payer: Self-pay

## 2019-01-13 ENCOUNTER — Ambulatory Visit (HOSPITAL_COMMUNITY)
Admission: EM | Admit: 2019-01-13 | Discharge: 2019-01-13 | Disposition: A | Discharge status: Home / Self Care | Payer: Medicare Other | Source: Home / Self Care

## 2019-01-13 ENCOUNTER — Emergency Department (HOSPITAL_COMMUNITY)
Admission: EM | Admit: 2019-01-13 | Discharge: 2019-01-13 | Disposition: A | Discharge status: Home / Self Care | Payer: Medicare Other | Attending: Emergency Medicine | Admitting: Emergency Medicine

## 2019-01-13 DIAGNOSIS — I10 Essential (primary) hypertension: Secondary | ICD-10-CM | POA: Diagnosis not present

## 2019-01-13 DIAGNOSIS — Z79899 Other long term (current) drug therapy: Secondary | ICD-10-CM | POA: Insufficient documentation

## 2019-01-13 DIAGNOSIS — E871 Hypo-osmolality and hyponatremia: Secondary | ICD-10-CM | POA: Diagnosis not present

## 2019-01-13 DIAGNOSIS — Z87891 Personal history of nicotine dependence: Secondary | ICD-10-CM | POA: Diagnosis not present

## 2019-01-13 LAB — CBC WITH DIFFERENTIAL/PLATELET
Abs Immature Granulocytes: 0.01 10*3/uL (ref 0.00–0.07)
Basophils Absolute: 0.1 10*3/uL (ref 0.0–0.1)
Basophils Relative: 1 %
Eosinophils Absolute: 0.3 10*3/uL (ref 0.0–0.5)
Eosinophils Relative: 6 %
HCT: 37.6 % (ref 36.0–46.0)
Hemoglobin: 12.6 g/dL (ref 12.0–15.0)
Immature Granulocytes: 0 %
Lymphocytes Relative: 47 %
Lymphs Abs: 2.3 10*3/uL (ref 0.7–4.0)
MCH: 29.1 pg (ref 26.0–34.0)
MCHC: 33.5 g/dL (ref 30.0–36.0)
MCV: 86.8 fL (ref 80.0–100.0)
Monocytes Absolute: 0.5 10*3/uL (ref 0.1–1.0)
Monocytes Relative: 10 %
Neutro Abs: 1.8 10*3/uL (ref 1.7–7.7)
Neutrophils Relative %: 36 %
Platelets: 435 10*3/uL — ABNORMAL HIGH (ref 150–400)
RBC: 4.33 MIL/uL (ref 3.87–5.11)
RDW: 12.6 % (ref 11.5–15.5)
WBC: 4.9 10*3/uL (ref 4.0–10.5)
nRBC: 0 % (ref 0.0–0.2)

## 2019-01-13 LAB — COMPREHENSIVE METABOLIC PANEL
ALT: 23 U/L (ref 0–44)
AST: 26 U/L (ref 15–41)
Albumin: 4.3 g/dL (ref 3.5–5.0)
Alkaline Phosphatase: 88 U/L (ref 38–126)
Anion gap: 11 (ref 5–15)
BUN: 7 mg/dL — ABNORMAL LOW (ref 8–23)
CO2: 25 mmol/L (ref 22–32)
Calcium: 9.2 mg/dL (ref 8.9–10.3)
Chloride: 91 mmol/L — ABNORMAL LOW (ref 98–111)
Creatinine, Ser: 0.65 mg/dL (ref 0.44–1.00)
GFR calc Af Amer: >60 mL/min (ref >60)
GFR calc non Af Amer: >60 mL/min (ref >60)
Glucose, Bld: 98 mg/dL (ref 70–99)
Potassium: 4.6 mmol/L (ref 3.5–5.1)
Sodium: 127 mmol/L — ABNORMAL LOW (ref 135–145)
Total Bilirubin: 0.5 mg/dL (ref 0.3–1.2)
Total Protein: 6.6 g/dL (ref 6.5–8.1)

## 2019-01-13 NOTE — ED Triage Notes (Signed)
Patient had blood draw 2 days ago and received a phone call saying sodium was low and to go to the ED. Went 2 days ago however waiting a long time and today went to the urgent care and told to go to the ED.  Also having lower back pain had a MRI last week.

## 2019-01-13 NOTE — ED Provider Notes (Signed)
[facility] EMERGENCY DEPARTMENT Provider Note   CSN: [id number] Arrival date & time: 01/13/19  1032     History   Chief Complaint Chief Complaint  Patient presents with  . Back Pain  . Abnormal lab    HPI Shannon Young is a 63 y.o. female who presents to the ER with Hyponatremia called by her pcp to come to the ER for Sodium of 118. She has a longstanding hx of hyponatremia and previously used salt tablets but stop taking it due to swelling. The patient went to [place] long 2 days ago but left due to wait. She denies change in mentation, weakness, spasms, abdominal pain, nausea, vomiting,.   She has been taking salt tablets since that time       HPI  Past Medical History:  Diagnosis Date  . Alcoholic hepatitis 0/93/2671  . Delirium tremens ([place]) 10/17/2017   Acute alcohol and benzo w/d: 08/2017  . Depression   . GERD (gastroesophageal reflux disease)   . Heart murmur   . Hyperlipidemia   . Hypertension   . Low sodium levels   . Osteopenia   . Tubular adenoma of colon 08/24/2018   Colonoscopy 07/2018, Dr. Laurier Nancy, repeat in 7 years.   . Vision abnormalities     Patient Active Problem List   Diagnosis Date Noted  . Incontinence of feces   . Tubular adenoma of colon 08/24/2018  . Recurrent major depressive disorder, in partial remission ([place]) 06/15/2018  . Urinary incontinence 02/27/2018  . Paresthesia 02/27/2018  . Memory loss 11/23/2017  . Major depression, recurrent, chronic ([place]) 10/17/2017  . Alcohol dependence in remission ([place]) 10/17/2017  . Insomnia secondary to chronic pain 10/17/2017  . Spinal stenosis of lumbar region 10/17/2017  . Alcoholic hepatitis 24/58/0998  . Delirium tremens ([place]) 10/17/2017  . Essential hypertension 06/29/2015  . Family history of ischemic heart disease (IHD) 06/29/2015  . Nonrheumatic mitral valve regurgitation 06/29/2015  . Orthostatic hypotension 06/29/2015  . GERD (gastroesophageal reflux disease)  05/10/2013  . Chronic pain syndrome 06/01/2010  . Opioid dependence ([place]) 06/01/2010  . Postlaminectomy syndrome, not elsewhere classified 06/01/2010  . DDD (degenerative disc disease), lumbosacral 03/19/2010    Past Surgical History:  Procedure Laterality Date  . ANAL RECTAL MANOMETRY N/A 11/26/2018   Procedure: ANO RECTAL MANOMETRY;  Surgeon: Thornton Park, MD;  Location: [facility];  Service: Gastroenterology;  Laterality: N/A;  . BACK SURGERY  2008,2017,2019   x 3, electrical implant and explant and attempt at insertion of pain pump  . BUNIONECTOMY  2003  . COLONOSCOPY    . MANDIBLE SURGERY  2005   repaired for proper function of mandible  . POSTERIOR LUMBAR VERTEBRAE EXCISION  09/15/2017   spacers put in L-4- L5 or 6 per pt statement  . TOTAL SHOULDER REPLACEMENT Left 2015     OB History   No obstetric history on file.      Home Medications    Prior to Admission medications   Medication Sig Start Date End Date Taking? Authorizing Provider  atorvastatin (LIPITOR) 20 MG tablet Take 1 tablet (20 mg total) by mouth at bedtime. 11/01/17   Leamon Arnt, MD  DULoxetine (CYMBALTA) 60 MG capsule Take 1 capsule (60 mg total) by mouth daily. 04/16/18   Leamon Arnt, MD  fentaNYL (DURAGESIC) 75 MCG/HR Place 1 patch onto the skin every 3 (three) days. 12/13/18   Leamon Arnt, MD  fluticasone (FLONASE) 50 MCG/ACT nasal spray Place 1 spray  into both nostrils as needed.     [provider]  folic acid (FOLVITE) 1 MG tablet Take by mouth. 10/07/17   [provider]  losartan (COZAAR) 100 MG tablet TAKE 1 TABLET BY MOUTH ONCE DAILY 04/18/18   Leamon Arnt, MD  mirabegron ER (MYRBETRIQ) 25 MG TB24 tablet Take 25 mg by mouth daily.    [provider]  Multiple Vitamin (MULTIVITAMIN) capsule Take by mouth daily.     [provider]  naloxone [facility]) nasal spray 4 mg/0.1 mL Spray into one nostril for suspected overdose. Repeat x1, if no  response after 3 minutes.  Dispense one package with 2 nasal sprays. 09/08/15   [provider]  OXcarbazepine (TRILEPTAL) 150 MG tablet Take 150 mg by mouth 2 (two) times daily.    [provider]  oxycodone (ROXICODONE) 30 MG immediate release tablet Take 60 mg by mouth 4 (four) times daily.  09/23/17   [provider]  pantoprazole (PROTONIX) 40 MG tablet Take 1 tablet (40 mg total) by mouth 2 (two) times daily. 11/17/17   Leamon Arnt, MD  thiamine 100 MG tablet Take by mouth. 10/07/17   [provider]  traZODone (DESYREL) 50 MG tablet Take 50-100 mg by mouth at bedtime. 05/25/18   [provider]  vitamin B-12 (CYANOCOBALAMIN) 500 MCG tablet Take 500 mcg by mouth daily.    [provider]    Family History Family History  Problem Relation Age of Onset  . Hypertension Mother   . Hypertension Father   . Hyperlipidemia Father   . Colon cancer Sister        ? or stomach  . Alcohol abuse Brother   . Hypertension Brother   . Healthy Daughter   . Healthy Son   . Healthy Daughter   . Healthy Daughter   . Colon polyps Neg Hx   . Esophageal cancer Neg Hx   . Rectal cancer Neg Hx   . Stomach cancer Neg Hx     Social History Social History   Tobacco Use  . Smoking status: Former Research scientist (life sciences)  . Smokeless tobacco: Never Used  Substance Use Topics  . Alcohol use: Not Currently  . Drug use: Never    Types: Fentanyl, Oxycodone     Allergies   Bupropion and Gabapentin   Review of Systems Review of Systems Ten systems reviewed and are negative for acute change, except as noted in the HPI.    Physical Exam Updated Vital Signs BP (!) 147/84 (BP Location: Right Arm)   Pulse 84   Temp 98.4 F (36.9 C) (Oral)   Resp 16   Ht 5\' 5"  (1.651 m)   Wt 70.3 kg   SpO2 98%   BMI 25.79 kg/m   Physical Exam Vitals signs and nursing note reviewed.  Constitutional:      General: She is not in acute distress.    Appearance: She is  well-developed. She is not diaphoretic.  HENT:     Head: Normocephalic and atraumatic.  Eyes:     General: No scleral icterus.    Conjunctiva/sclera: Conjunctivae normal.  Neck:     Musculoskeletal: Normal range of motion.  Cardiovascular:     Rate and Rhythm: Normal rate and regular rhythm.     Heart sounds: Normal heart sounds. No murmur. No friction rub. No gallop.   Pulmonary:     Effort: Pulmonary effort is normal. No respiratory distress.     Breath sounds: Normal  breath sounds.  Abdominal:     General: Bowel sounds are normal. There is no distension.     Palpations: Abdomen is soft. There is no mass.     Tenderness: There is no abdominal tenderness. There is no guarding.  Skin:    General: Skin is warm and dry.  Neurological:     Mental Status: She is alert and oriented to person, place, and time.  Psychiatric:        Behavior: Behavior normal.      ED Treatments / Results  Labs (all labs ordered are listed, but only abnormal results are displayed) Labs Reviewed  CBC WITH DIFFERENTIAL/PLATELET - Abnormal; Notable for the following components:      Result Value   Platelets 435 (*)    All other components within normal limits  COMPREHENSIVE METABOLIC PANEL - Abnormal; Notable for the following components:   Sodium 127 (*)    Chloride 91 (*)    BUN 7 (*)    All other components within normal limits    EKG None  Radiology No results found.  Procedures Procedures (including critical care time)  Medications Ordered in ED Medications - No data to display   Initial Impression / Assessment and Plan / ED Course  I have reviewed the triage vital signs and the nursing notes.  Pertinent labs & imaging results that were available during my care of the patient were reviewed by me and considered in my medical decision making (see chart for details).        I reviewed the patient's labs which shows mildly elevated platelet count, sodium is now 127 without if any  other significant abnormality.  Patient may continue to take her sodium tablets at home.  She is asymptomatic at this time.  Appears appropriate for discharge and follow-up with her primary care physician.  Final Clinical Impressions(s) / ED Diagnoses   Final diagnoses:  Hyponatremia    ED Discharge Orders    None       Margarita Mail, PA-C 01/16/19 0947    Lacretia Leigh, MD 01/19/19 [phone]

## 2019-01-13 NOTE — ED Triage Notes (Signed)
Mani PA evaluated patients labs and sodium level, discussed with provider about possibility of treatment here. Per Bess Harvest, pt sodium level is too low, pt needs more thourogh workup than we can provide here. Spoke with patient, pt agreeable to head to ER, reviewed several ERs to see wait times, pt given low wait time ERs and sent there. Pt stable for transporting herself.

## 2019-01-13 NOTE — Discharge Instructions (Addendum)
Contact a health care provider if: You develop worsening nausea, fatigue, headache, confusion, or weakness. Your symptoms go away and then return. You have problems following the recommended diet. Get help right away if: You have a seizure. You pass out. You have ongoing diarrhea or vomiting.

## 2019-01-14 ENCOUNTER — Telehealth: Payer: Self-pay

## 2019-01-14 NOTE — Progress Notes (Signed)
Please call patient to clarify her recent ED visit/care and recommended follow up.  Last Na was 127 yesterday.  She doesn't have a f/u visit scheduled with me.  She will need f/u with me and treatment and close monitoring of sodium levels.

## 2019-01-14 NOTE — Telephone Encounter (Signed)
Copied from [place] [id number]. Topic: Appointment Scheduling - Scheduling Inquiry for Clinic >> Jan 14, 2019  1:42 PM Shannon Young wrote: Reason for CRM: pt was seen in ED for low sodium and need follow up this week b/c she will be out of town next week

## 2019-01-14 NOTE — Telephone Encounter (Signed)
Pt has been called and scheduled.

## 2019-01-15 ENCOUNTER — Ambulatory Visit: Payer: Medicare Other | Admitting: Physical Therapy

## 2019-01-15 ENCOUNTER — Telehealth: Payer: Self-pay | Admitting: Physical Therapy

## 2019-01-15 NOTE — Telephone Encounter (Signed)
Called patient about her 3:30 PM appointment she missed and left a phone message.  Earlie Counts, PT @8 /18/2020@ 3:51 PM

## 2019-01-17 ENCOUNTER — Other Ambulatory Visit: Payer: Self-pay

## 2019-01-17 ENCOUNTER — Ambulatory Visit (INDEPENDENT_AMBULATORY_CARE_PROVIDER_SITE_OTHER): Payer: Medicare Other | Admitting: Family Medicine

## 2019-01-17 ENCOUNTER — Encounter: Payer: Self-pay | Admitting: Family Medicine

## 2019-01-17 VITALS — BP 128/82 | HR 74 | Temp 97.9°F | Resp 16 | Ht 65.25 in | Wt 158.4 lb

## 2019-01-17 DIAGNOSIS — Z23 Encounter for immunization: Secondary | ICD-10-CM

## 2019-01-17 DIAGNOSIS — E871 Hypo-osmolality and hyponatremia: Secondary | ICD-10-CM

## 2019-01-17 DIAGNOSIS — E222 Syndrome of inappropriate secretion of antidiuretic hormone: Secondary | ICD-10-CM

## 2019-01-17 MED ORDER — SODIUM CHLORIDE 1 G PO TABS: 1.0000 g | ORAL_TABLET | Freq: Once | ORAL | 3 refills | Status: AC

## 2019-01-17 NOTE — Progress Notes (Signed)
Subjective  CC:  Chief Complaint  Patient presents with  . Abnormal Sodium Level    She Started back taking salt tablets 8/14, reports she is taking about 8 daily    HPI: Shannon Young is a 63 y.o. female who presents to the office today to address the problems listed above in the chief complaint.  Recent labs show moderate hyponatremia due to pt stopping sodium tabs and fluid restriction back on April. Since 1st reading of 118, she started taking sodium tablets again: reports taking 8-10 daily!   Reports edema when she takes them  Denies MS changes.   Assessment  1. SIADH (syndrome of inappropriate ADH production) ([place])   2. Hyponatremia   3. Need for immunization against influenza      Plan   SIADH, idiopathic:  rec sodium 1gm daily. Recheck levels and restart fluid restriction. To endocrine if needed. Discussed not stopping or changing dose of recommended medications without discussing with me. Need at least q 3 month checks. Will consider stopping ARB.   Follow up: to be determined after labs back.   Visit date not found  Orders Placed This Encounter  Procedures  . Flu Vaccine QUAD 36+ mos IM  . Basic metabolic panel  . Osmolality, urine  . Sodium, urine, random   Meds ordered this encounter  Medications  . sodium chloride 1 g tablet    Sig: Take 1 tablet (1 g total) by mouth once for 1 dose.    Dispense:  90 tablet    Refill:  3      I reviewed the patients updated PMH, FH, and SocHx.    Patient Active Problem List   Diagnosis Date Noted  . Recurrent major depressive disorder, in partial remission ([place]) 06/15/2018    Priority: High  . Alcohol dependence in remission ([place]) 10/17/2017    Priority: High  . Insomnia secondary to chronic pain 10/17/2017    Priority: High  . Spinal stenosis of lumbar region 10/17/2017    Priority: High  . Alcoholic hepatitis 16/96/7893    Priority: High  . Essential hypertension 06/29/2015    Priority: High  . Chronic  pain syndrome 06/01/2010    Priority: High  . Opioid dependence ([place]) 06/01/2010    Priority: High  . Postlaminectomy syndrome, not elsewhere classified 06/01/2010    Priority: High  . DDD (degenerative disc disease), lumbosacral 03/19/2010    Priority: High  . Family history of ischemic heart disease (IHD) 06/29/2015    Priority: Medium  . GERD (gastroesophageal reflux disease) 05/10/2013    Priority: Medium  . Nonrheumatic mitral valve regurgitation 06/29/2015    Priority: Low  . SIADH (syndrome of inappropriate ADH production) ([place]) 01/17/2019  . Incontinence of feces   . Tubular adenoma of colon 08/24/2018  . Urinary incontinence 02/27/2018  . Paresthesia 02/27/2018  . Memory loss 11/23/2017  . Major depression, recurrent, chronic ([place]) 10/17/2017  . Delirium tremens ([place]) 10/17/2017  . Orthostatic hypotension 06/29/2015   Current Meds  Medication Sig  . atorvastatin (LIPITOR) 20 MG tablet Take 1 tablet (20 mg total) by mouth at bedtime.  . DULoxetine (CYMBALTA) 60 MG capsule Take 1 capsule (60 mg total) by mouth daily.  . fentaNYL (DURAGESIC) 75 MCG/HR Place 1 patch onto the skin every 3 (three) days.  . fluticasone (FLONASE) 50 MCG/ACT nasal spray Place 1 spray into both nostrils as needed.   . folic acid (FOLVITE) 1 MG tablet Take by mouth.  . losartan (COZAAR)  100 MG tablet TAKE 1 TABLET BY MOUTH ONCE DAILY  . mirabegron ER (MYRBETRIQ) 25 MG TB24 tablet Take 25 mg by mouth daily.  . Multiple Vitamin (MULTIVITAMIN) capsule Take by mouth daily.   . naloxone (NARCAN) nasal spray 4 mg/0.1 mL Spray into one nostril for suspected overdose. Repeat x1, if no response after 3 minutes.  Dispense one package with 2 nasal sprays.  . OXcarbazepine (TRILEPTAL) 150 MG tablet Take 150 mg by mouth 2 (two) times daily.  Marland Kitchen oxycodone (ROXICODONE) 30 MG immediate release tablet Take 60 mg by mouth 4 (four) times daily.   . pantoprazole (PROTONIX) 40 MG tablet Take 1 tablet (40 mg total) by  mouth 2 (two) times daily.  . sodium chloride 1 g tablet Take 1 tablet (1 g total) by mouth once for 1 dose.  . thiamine 100 MG tablet Take by mouth.  . traZODone (DESYREL) 50 MG tablet Take 50-100 mg by mouth at bedtime.  . vitamin B-12 (CYANOCOBALAMIN) 500 MCG tablet Take 500 mcg by mouth daily.  . [DISCONTINUED] sodium chloride 1 g tablet Take 1 g by mouth 3 (three) times daily.    Allergies: Patient is allergic to bupropion and gabapentin. Family History: Patient family history includes Alcohol abuse in her brother; Colon cancer in her sister; Healthy in her daughter, daughter, daughter, and son; Hyperlipidemia in her father; Hypertension in her brother, father, and mother. Social History:  Patient  reports that she has quit smoking. She has never used smokeless tobacco. She reports previous alcohol use. She reports that she does not use drugs.  Review of Systems: Constitutional: Negative for fever malaise or anorexia Cardiovascular: negative for chest pain Respiratory: negative for SOB or persistent cough Gastrointestinal: negative for abdominal pain  Objective  Vitals: BP 128/82   Pulse 74   Temp 97.9 F (36.6 C) (Tympanic)   Resp 16   Ht 5' 5.25" (1.657 m)   Wt 158 lb 6.4 oz (71.8 kg)   SpO2 95%   BMI 26.16 kg/m  General: no acute distress , A&Ox3 HEENT: PEERL, conjunctiva normal, Oropharynx moist,neck is supple Cardiovascular:  RRR without murmur or gallop.  Respiratory:  Good breath sounds bilaterally, CTAB with normal respiratory effort Skin:  Warm, no rashes Ext tr edema bilaterally Gastrointestinal: soft, flat abdomen, normal active bowel sounds, no palpable masses, no hepatosplenomegaly, no appreciated hernias   No visits with results within 1 Day(s) from this visit.  Latest known visit with results is:  Admission on 01/13/2019, Discharged on 01/13/2019  Component Date Value Ref Range Status  . WBC 01/13/2019 4.9  4.0 - 10.5 K/uL Final  . RBC 01/13/2019 4.33   3.87 - 5.11 MIL/uL Final  . Hemoglobin 01/13/2019 12.6  12.0 - 15.0 g/dL Final  . HCT 01/13/2019 37.6  36.0 - 46.0 % Final  . MCV 01/13/2019 86.8  80.0 - 100.0 fL Final  . MCH 01/13/2019 29.1  26.0 - 34.0 pg Final  . MCHC 01/13/2019 33.5  30.0 - 36.0 g/dL Final  . RDW 01/13/2019 12.6  11.5 - 15.5 % Final  . Platelets 01/13/2019 435* 150 - 400 K/uL Final  . nRBC 01/13/2019 0.0  0.0 - 0.2 % Final  . Neutrophils Relative % 01/13/2019 36  % Final  . Neutro Abs 01/13/2019 1.8  1.7 - 7.7 K/uL Final  . Lymphocytes Relative 01/13/2019 47  % Final  . Lymphs Abs 01/13/2019 2.3  0.7 - 4.0 K/uL Final  . Monocytes Relative 01/13/2019 10  %  Final  . Monocytes Absolute 01/13/2019 0.5  0.1 - 1.0 K/uL Final  . Eosinophils Relative 01/13/2019 6  % Final  . Eosinophils Absolute 01/13/2019 0.3  0.0 - 0.5 K/uL Final  . Basophils Relative 01/13/2019 1  % Final  . Basophils Absolute 01/13/2019 0.1  0.0 - 0.1 K/uL Final  . Immature Granulocytes 01/13/2019 0  % Final  . Abs Immature Granulocytes 01/13/2019 0.01  0.00 - 0.07 K/uL Final  . Sodium 01/13/2019 127* 135 - 145 mmol/L Final  . Potassium 01/13/2019 4.6  3.5 - 5.1 mmol/L Final  . Chloride 01/13/2019 91* 98 - 111 mmol/L Final  . CO2 01/13/2019 25  22 - 32 mmol/L Final  . Glucose, Bld 01/13/2019 98  70 - 99 mg/dL Final  . BUN 01/13/2019 7* 8 - 23 mg/dL Final  . Creatinine, Ser 01/13/2019 0.65  0.44 - 1.00 mg/dL Final  . Calcium 01/13/2019 9.2  8.9 - 10.3 mg/dL Final  . Total Protein 01/13/2019 6.6  6.5 - 8.1 g/dL Final  . Albumin 01/13/2019 4.3  3.5 - 5.0 g/dL Final  . AST 01/13/2019 26  15 - 41 U/L Final  . ALT 01/13/2019 23  0 - 44 U/L Final  . Alkaline Phosphatase 01/13/2019 88  38 - 126 U/L Final  . Total Bilirubin 01/13/2019 0.5  0.3 - 1.2 mg/dL Final  . GFR calc non Af Amer 01/13/2019 >60  >60 mL/min Final  . GFR calc Af Amer 01/13/2019 >60  >60 mL/min Final  . Anion gap 01/13/2019 11  5 - 15 Final      Commons side effects, risks,  benefits, and alternatives for medications and treatment plan prescribed today were discussed, and the patient expressed understanding of the given instructions. Patient is instructed to call or message via MyChart if he/she has any questions or concerns regarding our treatment plan. No barriers to understanding were identified. We discussed Red Flag symptoms and signs in detail. Patient expressed understanding regarding what to do in case of urgent or emergency type symptoms.   Medication list was reconciled, printed and provided to the patient in AVS. Patient instructions and summary information was reviewed with the patient as documented in the AVS. This note was prepared with assistance of Dragon voice recognition software. Occasional wrong-word or sound-a-like substitutions may have occurred due to the inherent limitations of voice recognition software

## 2019-01-17 NOTE — Patient Instructions (Signed)
We will call you with your sodium results.   I will let you know when I want you to return for follow up/recheck   Please take one salt tablet daily.  Please limit your total daily fluid to 155ml / day.

## 2019-01-18 ENCOUNTER — Other Ambulatory Visit: Payer: Self-pay

## 2019-01-18 DIAGNOSIS — E871 Hypo-osmolality and hyponatremia: Secondary | ICD-10-CM

## 2019-01-18 LAB — BASIC METABOLIC PANEL
BUN: 9 mg/dL (ref 6–23)
CO2: 32 mEq/L (ref 19–32)
Calcium: 9.2 mg/dL (ref 8.4–10.5)
Chloride: 86 mEq/L — ABNORMAL LOW (ref 96–112)
Creatinine, Ser: 0.66 mg/dL (ref 0.40–1.20)
GFR: 90.93 mL/min (ref >60.00)
Glucose, Bld: 91 mg/dL (ref 70–99)
Potassium: 4.3 mEq/L (ref 3.5–5.1)
Sodium: 124 mEq/L — ABNORMAL LOW (ref 135–145)

## 2019-01-22 ENCOUNTER — Other Ambulatory Visit: Payer: Self-pay | Admitting: *Deleted

## 2019-01-22 LAB — OSMOLALITY, URINE

## 2019-01-22 LAB — SODIUM, URINE, RANDOM: Sodium, Ur: 29 mmol/L (ref 28–272)

## 2019-01-22 MED ORDER — SODIUM CHLORIDE 1 G PO TABS: 1.0000 g | ORAL_TABLET | Freq: Every day | ORAL | 3 refills | Status: DC

## 2019-01-25 ENCOUNTER — Other Ambulatory Visit: Payer: Self-pay

## 2019-01-28 ENCOUNTER — Ambulatory Visit (INDEPENDENT_AMBULATORY_CARE_PROVIDER_SITE_OTHER): Payer: Medicare Other | Admitting: Neurology

## 2019-01-28 ENCOUNTER — Encounter: Payer: Self-pay | Admitting: Neurology

## 2019-01-28 ENCOUNTER — Other Ambulatory Visit: Payer: Self-pay

## 2019-01-28 DIAGNOSIS — G894 Chronic pain syndrome: Secondary | ICD-10-CM

## 2019-01-28 DIAGNOSIS — R202 Paresthesia of skin: Secondary | ICD-10-CM | POA: Diagnosis not present

## 2019-01-28 DIAGNOSIS — F319 Bipolar disorder, unspecified: Secondary | ICD-10-CM | POA: Diagnosis not present

## 2019-01-28 NOTE — Progress Notes (Signed)
Please refer to EMG and nerve conduction procedure note.  

## 2019-01-28 NOTE — Procedures (Signed)
HISTORY:  Shannon Young is a 63 year old patient with a history of prior lumbosacral spine surgery on multiple occasions in the past, the last surgery was about 1 year ago.  She reports intermittent sensations of numbness in the fingers and the toes that have been persistent over the last year.  She denies any significant radicular pain down the arms or down the legs from the spine.  She is being evaluated for possible neuropathy or a radiculopathy.  NERVE CONDUCTION STUDIES:  Nerve conduction studies were performed on the right upper extremity. The distal motor latencies and motor amplitudes for the median and ulnar nerves were within normal limits. The nerve conduction velocities for these nerves were also normal. The sensory latencies for the median and ulnar nerves were normal. The F wave latency for the ulnar nerve was within normal limits.  Nerve conduction studies were performed on both lower extremities.  The distal motor latencies for the peroneal and posterior tibial nerves were normal bilaterally with low motor amplitudes seen for the posterior tibial nerves bilaterally and for the right peroneal nerve, normal for the left peroneal nerve.  Slowing was seen for the right peroneal nerve and for the posterior tibial nerves bilaterally, normal for the left peroneal nerve.  The sensory latencies for the sural and peroneal nerves were within normal limits bilaterally.  The F-wave latencies for the posterior tibial nerves were prolonged bilaterally.  EMG STUDIES:  EMG study was performed on the right upper extremity:  The first dorsal interosseous muscle reveals 2 to 4 K units with full recruitment. No fibrillations or positive waves were noted. The abductor pollicis brevis muscle reveals 2 to 4 K units with full recruitment. No fibrillations or positive waves were noted. The extensor indicis proprius muscle reveals 1 to 3 K units with full recruitment. No fibrillations or positive waves  were noted. The pronator teres muscle reveals 2 to 3 K units with full recruitment. No fibrillations or positive waves were noted. The biceps muscle reveals 1 to 2 K units with full recruitment. No fibrillations or positive waves were noted. The triceps muscle reveals 2 to 4 K units with full recruitment. No fibrillations or positive waves were noted. The anterior deltoid muscle reveals 2 to 3 K units with full recruitment. No fibrillations or positive waves were noted. The cervical paraspinal muscles were tested at 2 levels. No abnormalities of insertional activity were seen at either level tested. There was good relaxation.  EMG study was performed on the right lower extremity:  The tibialis anterior muscle reveals 2 to 4K motor units with full recruitment. No fibrillations or positive waves were seen. The peroneus tertius muscle reveals 2 to 4K motor units with slightly decreased recruitment. No fibrillations or positive waves were seen. The medial gastrocnemius muscle reveals 1 to 3K motor units with full recruitment. No fibrillations or positive waves were seen. The vastus lateralis muscle reveals 2 to 4K motor units with full recruitment. No fibrillations or positive waves were seen. The iliopsoas muscle reveals 2 to 4K motor units with full recruitment. No fibrillations or positive waves were seen. The biceps femoris muscle (long head) reveals 2 to 4K motor units with full recruitment. No fibrillations or positive waves were seen. The lumbosacral paraspinal muscles were tested at 3 levels, and revealed no abnormalities of insertional activity at all 3 levels tested. There was good relaxation.   IMPRESSION:  Nerve conduction studies done on the right upper extremity and both lower extremities shows  primarily motor slowing in the lower extremities with sensory sparing.  A primary motor neuropathy or residual findings from prior lumbosacral spine surgery should be considered.  EMG evaluation  of the right upper extremity and on the right lower extremity were essentially normal, no evidence of a lumbar or cervical radiculopathy was seen.  Jill Alexanders MD 01/28/2019 1:57 PM  [facility] [address] [place], [place] [address]  Phone [phone] Fax [phone]

## 2019-01-29 ENCOUNTER — Ambulatory Visit: Payer: Medicare Other | Attending: Gastroenterology | Admitting: Physical Therapy

## 2019-01-29 ENCOUNTER — Other Ambulatory Visit: Payer: Self-pay

## 2019-01-29 ENCOUNTER — Encounter: Payer: Self-pay | Admitting: Internal Medicine

## 2019-01-29 ENCOUNTER — Ambulatory Visit (INDEPENDENT_AMBULATORY_CARE_PROVIDER_SITE_OTHER): Payer: Medicare Other | Admitting: Internal Medicine

## 2019-01-29 VITALS — BP 138/72 | HR 78 | Temp 98.4°F | Ht 65.0 in | Wt 155.4 lb

## 2019-01-29 DIAGNOSIS — R278 Other lack of coordination: Secondary | ICD-10-CM | POA: Insufficient documentation

## 2019-01-29 DIAGNOSIS — E871 Hypo-osmolality and hyponatremia: Secondary | ICD-10-CM | POA: Diagnosis not present

## 2019-01-29 DIAGNOSIS — R159 Full incontinence of feces: Secondary | ICD-10-CM | POA: Insufficient documentation

## 2019-01-29 DIAGNOSIS — R32 Unspecified urinary incontinence: Secondary | ICD-10-CM | POA: Insufficient documentation

## 2019-01-29 DIAGNOSIS — M6281 Muscle weakness (generalized): Secondary | ICD-10-CM | POA: Insufficient documentation

## 2019-01-29 MED ORDER — SODIUM CHLORIDE 1 G PO TABS: 2.0000 g | ORAL_TABLET | Freq: Every day | ORAL | 3 refills | Status: DC

## 2019-01-29 NOTE — Progress Notes (Signed)
[place]    Nerve / Sites Muscle Latency Ref. Amplitude Ref. Rel Amp Segments Distance Velocity Ref. Area    ms ms mV mV %  cm m/s m/s mVms  R Median - APB     Wrist APB 3.4 ?4.4 8.4 ?4.0 100 Wrist - APB 7   31.5     Upper arm APB 7.7  7.8  92.8 Upper arm - Wrist 23 53 ?49 29.7  R Ulnar - ADM     Wrist ADM 2.7 ?3.3 14.0 ?6.0 100 Wrist - ADM 7   37.8     B.Elbow ADM 6.7  13.1  94.1 B.Elbow - Wrist 20 50 ?49 37.0     A.Elbow ADM 8.7  13.0  99 A.Elbow - B.Elbow 10 49 ?49 36.6         A.Elbow - Wrist      L Peroneal - EDB     Ankle EDB 4.3 ?6.5 3.7 ?2.0 100 Ankle - EDB 9   10.1     Fib head EDB 10.9  2.8  75.6 Fib head - Ankle 29 44 ?44 9.9     Pop fossa EDB 13.2  2.8  101 Pop fossa - Fib head 10 44 ?44 8.8         Pop fossa - Ankle      R Peroneal - EDB     Ankle EDB 6.8 ?6.5 1.2 ?2.0 100 Ankle - EDB 9   5.9     Fib head EDB 14.3  1.2  102 Fib head - Ankle 28 37 ?44 5.2     Pop fossa EDB 17.0  1.1  88.5 Pop fossa - Fib head 10 37 ?44 3.9         Pop fossa - Ankle      L Tibial - AH     Ankle AH 3.0 ?5.8 3.9 ?4.0 100 Ankle - AH 9   9.8     Pop fossa AH 12.3  3.9  101 Pop fossa - Ankle 36 38 ?41 9.1  R Tibial - AH     Ankle AH 5.0 ?5.8 3.2 ?4.0 100 Ankle - AH 9   11.8     Pop fossa AH 14.9  2.2  68.1 Pop fossa - Ankle 36 36 ?41 12.0                      SNC    Nerve / Sites Rec. Site Peak Lat Ref.  Amp Ref. Segments Distance    ms ms V V  cm  L Sural - Ankle (Calf)     Calf Ankle 3.6 ?4.4 4 ?6 Calf - Ankle 14  R Sural - Ankle (Calf)     Calf Ankle 3.8 ?4.4 6 ?6 Calf - Ankle 14  L Superficial peroneal - Ankle     Lat leg Ankle 2.5 ?4.4 6 ?6 Lat leg - Ankle 14  R Superficial peroneal - Ankle     Lat leg Ankle 4.2 ?4.4 2 ?6 Lat leg - Ankle 14  R Median - Orthodromic (Dig II, Mid palm)     Dig II Wrist 3.3 ?3.4 13 ?10 Dig II - Wrist 13  R Ulnar - Orthodromic, (Dig V, Mid palm)     Dig V Wrist 3.0 ?3.1 7 ?5 Dig V - Wrist 48                 F  Wave    Nerve F  Lat Ref.   ms ms   R Ulnar - ADM 29.8 ?32.0  L Tibial - AH 56.6 ?56.0  R Tibial - AH 60.2 ?56.0

## 2019-01-29 NOTE — Patient Instructions (Signed)
-   I strongly recommend calling your psychiatrist and ask them to change Trileptal due to low serum sodium  - Increase Salt tablets to two times a day    Limit fluid intake to 1 Liter a day ~ 34 ounce    Please schedule a nurse visit appointment to check your adrenal gland (Cosyntropin)

## 2019-01-29 NOTE — Progress Notes (Signed)
Name: Shannon Young  MRN/ DOB: [id number], Jul 26, 1955    Age/ Sex: 63 y.o., female    PCP: Leamon Arnt, MD   Reason for Endocrinology Evaluation: Hyponatremia     Date of Initial Endocrinology Evaluation: 01/29/2019     HPI: Shannon Young is a 63 y.o. female with a past medical history of HTN, alcohol abuse, depression and opioid dependence. The patient presented for initial endocrinology clinic visit on 01/29/2019 for consultative assistance with her hyponatremia.   Pt noted with hyponatremia since 2019.  She has chronic fatigue. She denies nausea, or diarrhea.   Denies dizziness  Weight is steady   Has tingling and numbness of toes and fingers.    She drinks a lot of sodas used to be 8 a day but now is 4 cans a day, she also drinks 4 cups Of coffee a day   Stopped ETOH last year 08/2017   Sees Psychiatry ( [place] )      HISTORY:  Past Medical History:  Past Medical History:  Diagnosis Date  . Alcoholic hepatitis 3/70/9643  . Delirium tremens ([place]) 10/17/2017   Acute alcohol and benzo w/d: 08/2017  . Depression   . GERD (gastroesophageal reflux disease)   . Heart murmur   . Hyperlipidemia   . Hypertension   . Low sodium levels   . Osteopenia   . Tubular adenoma of colon 08/24/2018   Colonoscopy 07/2018, Dr. Laurier Nancy, repeat in 7 years.   . Vision abnormalities    Past Surgical History:  Past Surgical History:  Procedure Laterality Date  . ANAL RECTAL MANOMETRY N/A 11/26/2018   Procedure: ANO RECTAL MANOMETRY;  Surgeon: Thornton Park, MD;  Location: [facility];  Service: Gastroenterology;  Laterality: N/A;  . BACK SURGERY  2008,2017,2019   x 3, electrical implant and explant and attempt at insertion of pain pump  . BUNIONECTOMY  2003  . COLONOSCOPY    . MANDIBLE SURGERY  2005   repaired for proper function of mandible  . POSTERIOR LUMBAR VERTEBRAE EXCISION  09/15/2017   spacers put in L-4- L5 or 6 per pt statement  . TOTAL SHOULDER  REPLACEMENT Left 2015      Social History:  reports that she has been smoking e-cigarettes. She has never used smokeless tobacco. She reports previous alcohol use. She reports that she does not use drugs.  Family History: family history includes Alcohol abuse in her brother; Colon cancer in her sister; Healthy in her daughter, daughter, daughter, and son; Hyperlipidemia in her father; Hypertension in her brother, father, and mother.   HOME MEDICATIONS: Allergies as of 01/29/2019      Reactions   Bupropion    Gabapentin Other (See Comments)   Pt states that she has jerking movements and arms and fingers go numb Other reaction(s): Numbness Numbness, spasms, weakness      Medication List       Accurate as of January 29, 2019  2:47 PM. If you have any questions, ask your nurse or doctor.        atorvastatin 20 MG tablet Commonly known as: LIPITOR Take 1 tablet (20 mg total) by mouth at bedtime.   DULoxetine 60 MG capsule Commonly known as: CYMBALTA Take 1 capsule (60 mg total) by mouth daily.   fentaNYL 75 MCG/HR Commonly known as: [place] 1 patch onto the skin every 3 (three) days.   fluticasone 50 MCG/ACT nasal spray Commonly known as: FLONASE Place 1 spray into both  nostrils as needed.   folic acid 1 MG tablet Commonly known as: FOLVITE Take by mouth.   Glucosamine 500 MG Caps Take by mouth 2 (two) times daily.   losartan 100 MG tablet Commonly known as: COZAAR TAKE 1 TABLET BY MOUTH ONCE DAILY   multivitamin capsule Take by mouth daily.   Myrbetriq 25 MG Tb24 tablet Generic drug: mirabegron ER Take 25 mg by mouth daily.   Narcan 4 MG/0.1ML Liqd nasal spray kit Generic drug: naloxone Spray into one nostril for suspected overdose. Repeat x1, if no response after 3 minutes.  Dispense one package with 2 nasal sprays.   oxycodone 30 MG immediate release tablet Commonly known as: ROXICODONE Take 60 mg by mouth 4 (four) times daily.   pantoprazole  40 MG tablet Commonly known as: PROTONIX Take 1 tablet (40 mg total) by mouth 2 (two) times daily.   sodium chloride 1 g tablet Take 1 tablet (1 g total) by mouth daily.   thiamine 100 MG tablet Take by mouth.   traZODone 50 MG tablet Commonly known as: DESYREL Take 50-100 mg by mouth at bedtime.   Trileptal 150 MG tablet Generic drug: OXcarbazepine Take 300 mg by mouth 2 (two) times daily.   vitamin B-12 500 MCG tablet Commonly known as: CYANOCOBALAMIN Take 500 mcg by mouth daily.         REVIEW OF SYSTEMS: A comprehensive ROS was conducted with the patient and is negative except as per HPI and below:  Review of Systems  HENT: Negative for congestion and sore throat.   Eyes: Negative for blurred vision and pain.  Cardiovascular: Negative for chest pain and palpitations.  Gastrointestinal: Negative for nausea and vomiting.  Genitourinary: Positive for frequency.  Neurological: Positive for tingling. Negative for dizziness.  Endo/Heme/Allergies: Positive for polydipsia.       OBJECTIVE:  VS: BP 138/72 (BP Location: Left Arm, Patient Position: Sitting, Cuff Size: Large)   Pulse 78   Temp 98.4 F (36.9 C)   Ht '5\' 5"'  (1.651 m)   Wt 155 lb 6.4 oz (70.5 kg)   SpO2 98%   BMI 25.86 kg/m    Wt Readings from Last 3 Encounters:  01/29/19 155 lb 6.4 oz (70.5 kg)  01/17/19 158 lb 6.4 oz (71.8 kg)  01/13/19 155 lb (70.3 kg)     EXAM: General: Pt appears well and is in NAD  Hydration: Well-hydrated with moist mucous membranes and good skin turgor  Eyes: External eye exam normal without stare, lid lag or exophthalmos.  EOM intact.    Ears, Nose, Throat: Hearing: Grossly intact bilaterally Dental: Good dentition  Throat: Clear without mass, erythema or exudate  Neck: General: Supple without adenopathy. Thyroid: Thyroid size normal.    Lungs: Clear with good BS bilat with no rales, rhonchi, or wheezes  Heart: Auscultation: RRR.  Abdomen: Normoactive bowel sounds,  soft, nontender, without masses or organomegaly palpable  Extremities:  BL LE: No pretibial edema normal ROM and strength.  Skin: Hair: Texture and amount normal with gender appropriate distribution Skin Inspection: No rashes, acanthosis nigricans/skin tags. No lipohypertrophy Skin Palpation: Skin temperature, texture, and thickness normal to palpation  Neuro: Cranial nerves: II - XII grossly intact  Motor: Normal strength throughout DTRs: 2+ and symmetric in UE without delay in relaxation phase  Mental Status: Judgment, insight: Intact Orientation: Oriented to time, place, and person Mood and affect: No depression, anxiety, or agitation     DATA REVIEWED: Results for ALAINE, LOUGHNEY (MRN [id number])  as of 01/30/2019 11:38  Ref. Range 06/25/2018 13:35 06/26/2018 16:35  Sodium Latest Ref Range: 135 - 145 mEq/L 127 (L)   Potassium Latest Ref Range: 3.5 - 5.1 mEq/L 4.1   Chloride Latest Ref Range: 96 - 112 mEq/L 92 (L)   CO2 Latest Ref Range: 19 - 32 mEq/L 26   Glucose Latest Ref Range: 70 - 99 mg/dL 96   BUN Latest Ref Range: 6 - 23 mg/dL 12   Creatinine Latest Ref Range: 0.40 - 1.20 mg/dL 0.74   Calcium Latest Ref Range: 8.4 - 10.5 mg/dL 9.3   GFR Latest Ref Range: >60.00 mL/min 79.84   Chloride Urine Latest Ref Range: 32 - 290 mmol/L  32  Osmolality, Urine Latest Ref Range: 50 - 1,200 mOsm/kg  392  Sodium, Urine Latest Ref Range: 28 - 272 mmol/L  41  Results for ARDELLA, CHHIM (MRN [id number]) as of 01/30/2019 15:47  Ref. Range 01/29/2019 15:28  Sodium Latest Ref Range: 135 - 145 mEq/L 127 (L)  Potassium Latest Ref Range: 3.5 - 5.1 mEq/L 4.5  Chloride Latest Ref Range: 96 - 112 mEq/L 90 (L)  CO2 Latest Ref Range: 19 - 32 mEq/L 30  Glucose Latest Ref Range: 70 - 99 mg/dL 99  BUN Latest Ref Range: 6 - 23 mg/dL 8  Creatinine Latest Ref Range: 0.40 - 1.20 mg/dL 0.63  Calcium Latest Ref Range: 8.4 - 10.5 mg/dL 9.4  GFR Latest Ref Range: >60.00 mL/min 95.94    ASSESSMENT/PLAN/RECOMMENDATIONS:    1. Hyponatremic hyponatremia :   - Pt with fatigue but no other symptoms of hyponatremia - This is multifactorial given chronic use of Cymbalta, narcotic pain meds, Oxcarbazepine and PPI use,  all are contributing to her hyponatremia.  - Her hyponatremia has worsened in 12/2018 which is most likely due to the latest addition of Oxcarbazepine. I have urged her to contact her psychiatrist to discuss the change due to hyponatremia.  - We discussed the dangers and consequences of hyponatremia - Pt admits to non-compliance with fluid restriction.  - She had self increased salt tablet intake to 8 tabs a day for 2 weeks, hence the increase of Na from 118 to 127 mEq/L  - I have advised the patient to do her best in limiting fluid intake .  - My recommendation would be to try and treat the underlying cause if possible, which would be changing all the above listed meds . Pt does not believe her pain meds can be altered but will defer changing cymbalta to psychiatry and will defer switching pantoprazole to the discretion of her PCP (e.g pepcid)    - In the meantime I will test her for adrenal insufficiency, which we did discuss that in chronic opiate users the hypothalamic-pituitary-adrenal axis response is abnormal. She was unable to stay today for cosyntropin stim . Test. She will stop by at a later day - Thyroid function is normal.  - In the meantime she was encouraged to limit fluid intake to 34 oz daily     Medications : Increase salt tablet 1 g, to BID  Labs today and in 2 weeks       F/u in 3 months    Signed electronically by: Mack Guise, MD  [facility]  [facility] [address] [place], [place] [address] Phone: [phone] FAX: [phone]   CC: Leamon Arnt, [place] [address] Phone: [phone] Fax: [phone]   Return to  Endocrinology clinic as below: Future Appointments  Date Time  Provider [place]  01/29/2019  3:30 PM Monico Hoar, PT [facility]  02/05/2019  3:30 PM Monico Hoar, PT [facility]  02/12/2019  3:30 PM Monico Hoar, PT [facility]

## 2019-01-30 ENCOUNTER — Encounter: Payer: Self-pay | Admitting: Internal Medicine

## 2019-01-30 ENCOUNTER — Other Ambulatory Visit: Payer: Self-pay | Admitting: Family Medicine

## 2019-01-30 LAB — BASIC METABOLIC PANEL
BUN: 8 mg/dL (ref 6–23)
CO2: 30 mEq/L (ref 19–32)
Calcium: 9.4 mg/dL (ref 8.4–10.5)
Chloride: 90 mEq/L — ABNORMAL LOW (ref 96–112)
Creatinine, Ser: 0.63 mg/dL (ref 0.40–1.20)
GFR: 95.94 mL/min (ref >60.00)
Glucose, Bld: 99 mg/dL (ref 70–99)
Potassium: 4.5 mEq/L (ref 3.5–5.1)
Sodium: 127 mEq/L — ABNORMAL LOW (ref 135–145)

## 2019-02-04 ENCOUNTER — Other Ambulatory Visit: Payer: Self-pay | Admitting: Family Medicine

## 2019-02-05 ENCOUNTER — Other Ambulatory Visit: Payer: Self-pay

## 2019-02-05 ENCOUNTER — Ambulatory Visit: Payer: Medicare Other | Admitting: Physical Therapy

## 2019-02-05 ENCOUNTER — Encounter: Payer: Self-pay | Admitting: Physical Therapy

## 2019-02-05 DIAGNOSIS — R278 Other lack of coordination: Secondary | ICD-10-CM | POA: Diagnosis not present

## 2019-02-05 DIAGNOSIS — M6281 Muscle weakness (generalized): Secondary | ICD-10-CM | POA: Diagnosis not present

## 2019-02-05 DIAGNOSIS — R159 Full incontinence of feces: Secondary | ICD-10-CM | POA: Diagnosis not present

## 2019-02-05 DIAGNOSIS — R32 Unspecified urinary incontinence: Secondary | ICD-10-CM

## 2019-02-05 NOTE — Therapy (Addendum)
[facility] [address] [place], [place], [address] Phone: [phone]   Fax:  [phone]  Physical Therapy Treatment  Patient Details  Name: Shannon Young MRN: [id number] Date of Birth: [birth year] Referring Provider (PT): Dr. Thornton Park   Encounter Date: 02/05/2019  PT End of Session - 02/05/19 1537    Visit Number  4    Date for PT Re-Evaluation  02/13/19    Authorization Type  Medicare    PT Start Time  8828    PT Stop Time  1600    PT Time Calculation (min)  30 min    Activity Tolerance  Patient tolerated treatment well;No increased pain    Behavior During Therapy  WFL for tasks assessed/performed       Past Medical History:  Diagnosis Date  . Alcoholic hepatitis 0/07/4915  . Delirium tremens ([place]) 10/17/2017   Acute alcohol and benzo w/d: 08/2017  . Depression   . GERD (gastroesophageal reflux disease)   . Heart murmur   . Hyperlipidemia   . Hypertension   . Low sodium levels   . Osteopenia   . Tubular adenoma of colon 08/24/2018   Colonoscopy 07/2018, Dr. Laurier Nancy, repeat in 7 years.   . Vision abnormalities     Past Surgical History:  Procedure Laterality Date  . ANAL RECTAL MANOMETRY N/A 11/26/2018   Procedure: ANO RECTAL MANOMETRY;  Surgeon: Thornton Park, MD;  Location: [facility];  Service: Gastroenterology;  Laterality: N/A;  . BACK SURGERY  2008,2017,2019   x 3, electrical implant and explant and attempt at insertion of pain pump  . BUNIONECTOMY  2003  . COLONOSCOPY    . MANDIBLE SURGERY  2005   repaired for proper function of mandible  . POSTERIOR LUMBAR VERTEBRAE EXCISION  09/15/2017   spacers put in L-4- L5 or 6 per pt statement  . TOTAL SHOULDER REPLACEMENT Left 2015    There were no vitals filed for this visit.  Subjective Assessment - 02/05/19 1539    Subjective  Everything has been going better. I have been keeping up the exercises daily. No more leakage.    Diagnostic tests  x-ray shows a screw is broken in back, patient has a PT order for her back but has not scheduled an eval    Patient Stated Goals  more control over rectal muscles, work on back pain    Currently in Pain?  No/denies         [facility] PT Assessment - 02/05/19 0001      Assessment   Medical Diagnosis  R27.8 Dyssynergia    Referring Provider (PT)  Dr. 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    Leisure  drawing, painting, photography      Cognition   Overall Cognitive Status  Within Functional Limits for tasks assessed      Posture/Postural Control   Posture/Postural Control  Postural limitations    Postural Limitations  Rounded Shoulders;Forward head  AROM   Lumbar - Right Side Bend  full but goes slow    Lumbar - Left Side Bend  full but goes slowly      Strength   Overall Strength Comments  difficulty with standing on one leg with increased trunk sway    Right Hip Flexion  4/5    Right Hip External Rotation   5/5    Right Hip ABduction  4/5    Right Hip ADduction  5/5    Left Hip Flexion  4/5    Left Hip External Rotation  5/5    Left Hip ABduction  5/5    Left Hip ADduction  5/5      Palpation   SI assessment   pelvis in correct alignment                Pelvic Floor Special Questions - 02/05/19 0001    Urinary Leakage  Yes   70% better   Fecal incontinence  No        [facility] Adult PT Treatment/Exercise - 02/05/19 0001      Lumbar Exercises: Seated   Other Seated Lumbar Exercises  ball squeeze hold 10 sec 10x with pelvic floor contraction; resisted hip flexion  hold 5 sec. with pelvic floor contraction; contract pelvic floor 10 seconds 10x      Knee/Hip Exercises: Sidelying   Clams  10 times each side             PT Education - 02/05/19 1600    Education Details  Access Code: [email or web address]    Person(s) Educated  Patient    Methods  Explanation;Demonstration;Verbal cues;Handout    Comprehension  Returned demonstration;Verbalized understanding       PT Short Term Goals - 02/05/19 1604      PT SHORT TERM GOAL #1   Title  independent with initial HEP    Time  4    Period  Weeks    Status  Achieved    Target Date  01/16/19      PT SHORT TERM GOAL #2   Title  able to contract the rectum with circular contraction and lift and puborectalis coming forward    Time  4    Period  Weeks    Status  Achieved    Target Date  01/16/19      PT SHORT TERM GOAL #3   Title  understand how caffiene and sodas affect the bladder and why she should drink more water    Time  4    Period  Weeks    Status  Achieved    Target Date  01/16/19      PT SHORT TERM GOAL #4   Title  fecal leakage decreased >/= 25% due to increased muscle control    Time  4    Period  Weeks    Status  Achieved        PT Long Term Goals - 02/05/19 1605      PT LONG TERM GOAL #1   Title  independent with HEP and understand how to progress herself    Time  8    Period  Weeks    Status  Achieved      PT LONG TERM GOAL #2   Title  fecal leakage improved >/= 75% due to improved sphincter tone and strength >/= 3/5    Time  8    Period  Weeks    Status  Achieved  PT LONG TERM GOAL #3   Title  urinary leakage decreased >/= 50% due to improved pelvic floor strength    Time  8    Period  Weeks    Status  Achieved      PT LONG TERM GOAL #4   Title  improved feeling of the anal sphincter when stool is coming >/= 50% out due to improve tone    Time  8    Period  Weeks    Status  Achieved      PT LONG TERM GOAL #5   Title  able to fully evacuate the stool due to  improve tone of the sphincter    Time  8    Period  Weeks    Status  Achieved            Plan - 02/05/19 1538    Clinical Impression Statement  Patient has met all of her goals. Patient will be moving to [place] the end of the month and will be having back surgery. Patient reports urinary leakage improved by 70%. Patient reports no fecal leakage. Patient reports she has been doing her exercises and they help. Patient rectal strength is 3/5. Patient has increased bilateral hip strength. Patient is ready for discharge.    Personal Factors and Comorbidities  Fitness;Comorbidity 1;Comorbidity 2;Comorbidity 3+    Comorbidities  major depression, 5 back surgeries to lumbar, adenoma of colon    Examination-Activity Limitations  Continence;Stand;Toileting;Sit    Examination-Participation Restrictions  Interpersonal Relationship    Stability/Clinical Decision Making  Evolving/Moderate complexity    Rehab Potential  Good    PT Frequency  --    PT Duration  --    PT Treatment/Interventions  Biofeedback;Therapeutic activities;Therapeutic exercise;Patient/family education;Neuromuscular re-education;Manual techniques    PT Next Visit Plan  Discharge to HEP    PT Home Exercise Plan  Access Code: [email or web address]    Recommended Other Services  MD signed all notes    Consulted and Agree with Plan of Care  Patient       Patient will benefit from skilled therapeutic intervention in order to improve the following deficits and impairments:  Decreased coordination, Increased fascial restricitons, Decreased endurance, Increased muscle spasms, Pain, Decreased strength  Visit Diagnosis: Muscle weakness (generalized)  Other lack of coordination  Incontinence of feces, unspecified fecal incontinence type  Urinary incontinence, unspecified type     Problem List Patient Active Problem List   Diagnosis Date Noted  . SIADH (syndrome of inappropriate ADH production) ([place]) 01/17/2019  . Incontinence of feces    . Tubular adenoma of colon 08/24/2018  . Recurrent major depressive disorder, in partial remission ([place]) 06/15/2018  . Urinary incontinence 02/27/2018  . Paresthesia 02/27/2018  . Memory loss 11/23/2017  . Major depression, recurrent, chronic ([place]) 10/17/2017  . Alcohol dependence in remission ([place]) 10/17/2017  . Insomnia secondary to chronic pain 10/17/2017  . Spinal stenosis of lumbar region 10/17/2017  . Alcoholic hepatitis 02/54/2706  . Delirium tremens ([place]) 10/17/2017  . Essential hypertension 06/29/2015  . Family history of ischemic heart disease (IHD) 06/29/2015  . Nonrheumatic mitral valve regurgitation 06/29/2015  . Orthostatic hypotension 06/29/2015  . GERD (gastroesophageal reflux disease) 05/10/2013  . Chronic pain syndrome 06/01/2010  . Opioid dependence ([place]) 06/01/2010  . Postlaminectomy syndrome, not elsewhere classified 06/01/2010  . DDD (degenerative disc disease), lumbosacral 03/19/2010    Earlie Counts, PT 02/05/19 4:07 PM   [facility] [address] [place],  [place], [address] Phone: [phone]   Fax:  [phone]  Name: Shannon Young MRN: [id number] Date of Birth: January 29, 1956  PHYSICAL THERAPY DISCHARGE SUMMARY  Visits from Start of Care: 4  Current functional level related to goals / functional outcomes: See above. Patient is moving to [place] the end of the month.    Remaining deficits: See above.    Education / Equipment: HEP Plan: Patient agrees to discharge.  Patient goals were met. Patient is being discharged due to meeting the stated rehab goals.  Thank you for the referral. Earlie Counts, PT 02/11/19 11:47 AM  ?????

## 2019-02-05 NOTE — Patient Instructions (Signed)
Access Code: [email or web address]  URL: [email or web address]  Date: 02/05/2019  Prepared by: Earlie Counts   Exercises Sidelying Hip Adduction - 10 reps - 1 sets - 1x daily - 7x weekly Clamshell - 10 reps - 1 sets - 1x daily - 7x weekly Seated Pelvic Floor Contraction - 5 reps - 1 sets - 510sec hold - 4x daily - 7x weekly Seated Hip Adduction Isometrics with Ball - 10 reps - 1 sets - 10 hold - 1x daily - 7x weekly Hooklying Isometric Hip Flexion - 5 reps - 1 sets - 5 sec hold - 1x daily - 7x weekly [facility] [address], [place], [place] [address] Phone # [phone] Fax [phone]

## 2019-02-07 DIAGNOSIS — M5440 Lumbago with sciatica, unspecified side: Secondary | ICD-10-CM | POA: Diagnosis not present

## 2019-02-07 DIAGNOSIS — M961 Postlaminectomy syndrome, not elsewhere classified: Secondary | ICD-10-CM | POA: Diagnosis not present

## 2019-02-12 ENCOUNTER — Other Ambulatory Visit: Payer: Medicare Other

## 2019-02-12 ENCOUNTER — Ambulatory Visit: Payer: Medicare Other | Admitting: Physical Therapy

## 2019-02-12 ENCOUNTER — Other Ambulatory Visit: Payer: Self-pay | Admitting: Family Medicine

## 2019-02-20 ENCOUNTER — Ambulatory Visit (HOSPITAL_COMMUNITY)
Admission: EM | Admit: 2019-02-20 | Discharge: 2019-02-20 | Disposition: A | Discharge status: Home / Self Care | Payer: Medicare Other | Attending: Family Medicine | Admitting: Family Medicine

## 2019-02-20 ENCOUNTER — Encounter (HOSPITAL_COMMUNITY): Payer: Self-pay

## 2019-02-20 ENCOUNTER — Other Ambulatory Visit: Payer: Self-pay

## 2019-02-20 ENCOUNTER — Ambulatory Visit (INDEPENDENT_AMBULATORY_CARE_PROVIDER_SITE_OTHER): Payer: Medicare Other

## 2019-02-20 DIAGNOSIS — W19XXXA Unspecified fall, initial encounter: Secondary | ICD-10-CM

## 2019-02-20 DIAGNOSIS — S63501A Unspecified sprain of right wrist, initial encounter: Secondary | ICD-10-CM

## 2019-02-20 DIAGNOSIS — S6991XA Unspecified injury of right wrist, hand and finger(s), initial encounter: Secondary | ICD-10-CM | POA: Diagnosis not present

## 2019-02-20 DIAGNOSIS — M25531 Pain in right wrist: Secondary | ICD-10-CM | POA: Diagnosis not present

## 2019-02-20 DIAGNOSIS — M79641 Pain in right hand: Secondary | ICD-10-CM | POA: Diagnosis not present

## 2019-02-20 MED ORDER — IBUPROFEN 800 MG PO TABS: 800.0000 mg | ORAL_TABLET | Freq: Three times a day (TID) | ORAL | 0 refills | Status: DC | PRN

## 2019-02-20 NOTE — ED Triage Notes (Signed)
Pt states she tripped and fell and injured her right hand. Pt states he was walking  Her dog at the dog park. This happened today.

## 2019-02-20 NOTE — Discharge Instructions (Addendum)
No fractures Ice Use anti-inflammatories for pain/swelling. You may take up to 800 mg Ibuprofen every 8 hours with food. You may supplement Ibuprofen with Tylenol [phone] mg every 8 hours.   Follow up if not improving in 2 weeks

## 2019-02-21 ENCOUNTER — Ambulatory Visit: Payer: Medicare Other | Admitting: Family Medicine

## 2019-02-21 NOTE — ED Provider Notes (Signed)
[place]    CSN: [id number] Arrival date & time: 02/20/19  [place]      History   Chief Complaint Chief Complaint  Patient presents with   Hand Injury    HPI Shannon Young is a 63 y.o. female history of hypertension, hyperlipidemia, GERD, alcohol abuse, presenting today for evaluation of right hand/arm injury.  [place] the park earlier [place]pped and fell and landed on her right arm awkwardly.  Since she has had a lot of pain with any movement within her hand or wrist and feels the pain radiates up to her elbow.  She denies difficulty bending or straightening her elbow.  States that she has a remote history of injury to her wrist from when she was a child, but no other injury.    Hand Injury Associated symptoms: no fatigue and no fever     Past Medical History:  Diagnosis Date   Alcoholic hepatitis [id number]   Delirium tremens ([place]) 10/17/2017   Acute alcohol and benzo w/d: 08/2017   Depression    GERD (gastroesophageal reflux disease)    Heart murmur    Hyperlipidemia    Hypertension    Low sodium levels    Osteopenia    Tubular adenoma of colon 08/24/2018   Colonoscopy 07/2018, Dr. Laurier Nancy, repeat in 7 years.    Vision abnormalities     Patient Active Problem List   Diagnosis Date Noted   SIADH (syndrome of inappropriate ADH production) ([employer]) 01/17/2019   Incontinence of feces    Tubular adenoma of colon 08/24/2018   Recurrent major depressive disorder, in partial remission ([place]) 06/15/2018   Urinary incontinence 02/27/2018   Paresthesia 02/27/2018   Memory loss 11/23/2017   Major depression, recurrent, chronic ([place]) 10/17/2017   Alcohol dependence in remission ([place]) 10/17/2017   Insomnia secondary to chronic pain 10/17/2017   Spinal stenosis of lumbar region [id number]   Alcoholic hepatitis [id number]   Delirium tremens ([place]) 10/17/2017   Essential hypertension 06/29/2015   Family history of ischemic heart  disease (IHD) 06/29/2015   Nonrheumatic mitral valve regurgitation 06/29/2015   Orthostatic hypotension 06/29/2015   GERD (gastroesophageal reflux disease) 05/10/2013   Chronic pain syndrome 06/01/2010   Opioid dependence ([place]) 06/01/2010   Postlaminectomy syndrome, not elsewhere classified 06/01/2010   DDD (degenerative disc disease), lumbosacral 03/19/2010    Past Surgical History:  Procedure Laterality Date   ANAL RECTAL MANOMETRY N/A 11/26/2018   Procedure: ANO RECTAL MANOMETRY;  Surgeon: Thornton Park, MD;  Location: [facility];  Service: Gastroenterology;  Laterality: N/A;   BACK SURGERY  2008,2017,2019   x 3, electrical implant and explant and attempt at insertion of pain pump   BUNIONECTOMY  2003   COLONOSCOPY     MANDIBLE SURGERY  2005   repaired for proper function of mandible   POSTERIOR LUMBAR VERTEBRAE EXCISION  09/15/2017   spacers put in L-4- L5 or 6 per pt statement   TOTAL SHOULDER REPLACEMENT Left 2015    OB History   No obstetric history on file.      Home Medications    Prior to Admission medications   Medication Sig Start Date End Date Taking? Authorizing Provider  atorvastatin (LIPITOR) 20 MG tablet TAKE 1 TABLET BY MOUTH AT BEDTIME 02/05/19   Leamon Arnt, MD  DULoxetine (CYMBALTA) 60 MG capsule Take 1 capsule (60 mg total) by mouth daily. 04/16/18   Leamon Arnt, MD  fentaNYL (DURAGESIC) 75 MCG/HR Place 1 patch onto  the skin every 3 (three) days. 12/13/18   Leamon Arnt, MD  fluticasone (FLONASE) 50 MCG/ACT nasal spray Place 1 spray into both nostrils as needed.     [provider]  folic acid (FOLVITE) 1 MG tablet Take by mouth. 10/07/17   [provider]  Glucosamine 500 MG CAPS Take by mouth 2 (two) times daily.    [provider]  ibuprofen (ADVIL) 800 MG tablet Take 1 tablet (800 mg total) by mouth every 8 (eight) hours as needed. 02/20/19   Sharmila Wrobleski C, PA-C  losartan (COZAAR) 100 MG tablet  Take 1 tablet by mouth once daily 01/30/19   Leamon Arnt, MD  mirabegron ER (MYRBETRIQ) 25 MG TB24 tablet Take 25 mg by mouth daily.    [provider]  Multiple Vitamin (MULTIVITAMIN) capsule Take by mouth daily.     [provider]  naloxone [facility]) nasal spray 4 mg/0.1 mL Spray into one nostril for suspected overdose. Repeat x1, if no response after 3 minutes.  Dispense one package with 2 nasal sprays. 09/08/15   [provider]  ondansetron (ZOFRAN-ODT) 4 MG disintegrating tablet DISSOLVE 1 TABLET IN MOUTH EVERY 8 HOURS AS NEEDED FOR NAUSEA FOR VOMITING 02/12/19   Leamon Arnt, MD  OXcarbazepine (TRILEPTAL) 150 MG tablet Take 300 mg by mouth 2 (two) times daily.     [provider]  oxycodone (ROXICODONE) 30 MG immediate release tablet Take 60 mg by mouth 4 (four) times daily.  09/23/17   [provider]  pantoprazole (PROTONIX) 40 MG tablet Take 1 tablet by mouth twice daily 02/05/19   Leamon Arnt, MD  sodium chloride 1 g tablet Take 2 tablets (2 g total) by mouth daily. 01/29/19   Shamleffer, Melanie Crazier, MD  thiamine 100 MG tablet Take by mouth. 10/07/17   [provider]  traZODone (DESYREL) 50 MG tablet TAKE 1 TABLET BY MOUTH AT BEDTIME 02/12/19   Leamon Arnt, MD  vitamin B-12 (CYANOCOBALAMIN) 500 MCG tablet Take 500 mcg by mouth daily.    [provider]    Family History Family History  Problem Relation Age of Onset   Hypertension Mother    Hypertension Father    Hyperlipidemia Father    Colon cancer Sister        ? or stomach   Alcohol abuse Brother    Hypertension Brother    Healthy Daughter    Healthy Son    Healthy Daughter    Healthy Daughter    Colon polyps Neg Hx    Esophageal cancer Neg Hx    Rectal cancer Neg Hx    Stomach cancer Neg Hx     Social History Social History   Tobacco Use   Smoking status: Current Every Day Smoker    Types: E-cigarettes   Smokeless tobacco:  Never Used  Substance Use Topics   Alcohol use: Not Currently   Drug use: Never    Types: Fentanyl, Oxycodone     Allergies   Bupropion and Gabapentin   Review of Systems Review of Systems  Constitutional: Negative for fatigue and fever.  Eyes: Negative for visual disturbance.  Respiratory: Negative for shortness of breath.   Cardiovascular: Negative for chest pain.  Gastrointestinal: Negative for abdominal pain, nausea and vomiting.  Musculoskeletal: Positive for arthralgias, joint swelling and myalgias.  Skin: Positive for color change. Negative for rash and wound.  Neurological: Negative for dizziness, weakness, light-headedness and headaches.  Physical Exam Triage Vital Signs ED Triage Vitals  Enc Vitals Group     BP 02/20/19 1732 (!) 153/84     Pulse Rate 02/20/19 1732 85     Resp 02/20/19 1732 16     Temp 02/20/19 1732 98.1 F (36.7 C)     Temp Source 02/20/19 1732 Temporal     SpO2 02/20/19 1732 99 %     Weight 02/20/19 1739 155 lb (70.3 kg)     Height --      Head Circumference --      Peak Flow --      Pain Score 02/20/19 1739 10     Pain Loc --      Pain Edu? --      Excl. in [place]? --    No data found.  Updated Vital Signs BP (!) 153/84 (BP Location: Left Arm)    Pulse 85    Temp 98.1 F (36.7 C) (Temporal)    Resp 16    Wt 155 lb (70.3 kg)    SpO2 99%    BMI 25.79 kg/m   Visual Acuity Right Eye Distance:   Left Eye Distance:   Bilateral Distance:    Right Eye Near:   Left Eye Near:    Bilateral Near:     Physical Exam Vitals signs and nursing note reviewed.  Constitutional:      Appearance: She is well-developed.     Comments: No acute distress  HENT:     Head: Normocephalic and atraumatic.     Nose: Nose normal.  Eyes:     Conjunctiva/sclera: Conjunctivae normal.  Neck:     Musculoskeletal: Neck supple.  Cardiovascular:     Rate and Rhythm: Normal rate.  Pulmonary:     Effort: Pulmonary effort is normal. No respiratory  distress.  Abdominal:     General: There is no distension.  Musculoskeletal: Normal range of motion.     Comments: Right wrist/hand: Tenderness to palpation diffusely along distal radius ulna, dorsum of hand throughout first through fifth meta carpals.  Full active range of motion of fingers, although does elicit pain.  Patient reports decreased sensation through fingertips. Radial pulse 2+  Skin:    General: Skin is warm and dry.     Comments: fentyl patch present right upper arm Right arm: Faint erythema noted along dorsum of hand, distal forearm as well as mid upper arm  Neurological:     General: No focal deficit present.     Mental Status: She is alert and oriented to person, place, and time. Mental status is at baseline.     Cranial Nerves: No cranial nerve deficit.      UC Treatments / Results  Labs (all labs ordered are listed, but only abnormal results are displayed) Labs Reviewed - No data to display  EKG   Radiology Dg Wrist Complete Right  Result Date: 02/20/2019 CLINICAL DATA:  Fall, hand and wrist pain EXAM: RIGHT WRIST - COMPLETE 3+ VIEW COMPARISON:  Hand series today FINDINGS: No acute bony abnormality. Specifically, no fracture, subluxation, or dislocation. Small well corticated bone fragment adjacent to the tip of the ulnar styloid, likely related to old injury. Early arthritic changes in the radiocarpal joint with cystic change in the lunate. IMPRESSION: No acute bony abnormality. Electronically Signed   By: Rolm Baptise M.D.   On: 02/20/2019 19:02   Dg Hand Complete Right  Result Date: 02/20/2019 CLINICAL DATA:  Fall, hand and wrist pain. EXAM: RIGHT  HAND - COMPLETE 3+ VIEW COMPARISON:  None. FINDINGS: No acute bony abnormality. Specifically, no fracture, subluxation, or dislocation. Small well corticated bone fragment adjacent to the tip of the ulnar styloid, likely related to old injury. Early arthritic changes in the radiocarpal joint with cystic change in the  lunate. IMPRESSION: No acute bony abnormality. Electronically Signed   By: Rolm Baptise M.D.   On: 02/20/2019 19:01    Procedures Procedures (including critical care time)  Medications Ordered in UC Medications - No data to display  Initial Impression / Assessment and Plan / UC Course  I have reviewed the triage vital signs and the nursing notes.  Pertinent labs & imaging results that were available during my care of the patient were reviewed by me and considered in my medical decision making (see chart for details).     X-ray negative for acute bony abnormality.  Most likely strain/sprain.  Will provide thumb spica given diffuse tenderness, no signs of scaphoid injury at this time.  Anti-inflammatories to help with swelling.  Ice.  Would expect gradual resolution.  Follow-up in 2 weeks for repeat imaging if not having any improvement.Discussed strict return precautions. Patient verbalized understanding and is agreeable with plan.  Final Clinical Impressions(s) / UC Diagnoses   Final diagnoses:  Right wrist sprain, initial encounter     Discharge Instructions     No fractures Ice Use anti-inflammatories for pain/swelling. You may take up to 800 mg Ibuprofen every 8 hours with food. You may supplement Ibuprofen with Tylenol [phone] mg every 8 hours.   Follow up if not improving in 2 weeks   ED Prescriptions    Medication Sig Dispense Auth. Provider   ibuprofen (ADVIL) 800 MG tablet Take 1 tablet (800 mg total) by mouth every 8 (eight) hours as needed. 30 tablet Lydiana Milley, [place] C, PA-C     I have reviewed the PDMP during this encounter.   Janith Lima, PA-C 02/21/19 1041

## 2019-03-05 DIAGNOSIS — M961 Postlaminectomy syndrome, not elsewhere classified: Secondary | ICD-10-CM | POA: Diagnosis not present

## 2019-03-05 DIAGNOSIS — M5440 Lumbago with sciatica, unspecified side: Secondary | ICD-10-CM | POA: Diagnosis not present

## 2019-03-21 DIAGNOSIS — F17290 Nicotine dependence, other tobacco product, uncomplicated: Secondary | ICD-10-CM | POA: Diagnosis not present

## 2019-03-21 DIAGNOSIS — Z01810 Encounter for preprocedural cardiovascular examination: Secondary | ICD-10-CM | POA: Diagnosis not present

## 2019-03-21 DIAGNOSIS — E871 Hypo-osmolality and hyponatremia: Secondary | ICD-10-CM | POA: Diagnosis not present

## 2019-03-21 DIAGNOSIS — Z01818 Encounter for other preprocedural examination: Secondary | ICD-10-CM | POA: Diagnosis not present

## 2019-03-21 DIAGNOSIS — E782 Mixed hyperlipidemia: Secondary | ICD-10-CM | POA: Diagnosis not present

## 2019-03-21 DIAGNOSIS — I10 Essential (primary) hypertension: Secondary | ICD-10-CM | POA: Diagnosis not present

## 2019-03-26 DIAGNOSIS — M961 Postlaminectomy syndrome, not elsewhere classified: Secondary | ICD-10-CM | POA: Diagnosis not present

## 2019-03-26 DIAGNOSIS — M5441 Lumbago with sciatica, right side: Secondary | ICD-10-CM | POA: Diagnosis not present

## 2019-03-26 DIAGNOSIS — M5442 Lumbago with sciatica, left side: Secondary | ICD-10-CM | POA: Diagnosis not present

## 2019-03-26 DIAGNOSIS — M5440 Lumbago with sciatica, unspecified side: Secondary | ICD-10-CM | POA: Diagnosis not present

## 2019-04-02 DIAGNOSIS — E871 Hypo-osmolality and hyponatremia: Secondary | ICD-10-CM | POA: Diagnosis not present

## 2019-04-02 DIAGNOSIS — I10 Essential (primary) hypertension: Secondary | ICD-10-CM | POA: Diagnosis not present

## 2019-04-02 DIAGNOSIS — E785 Hyperlipidemia, unspecified: Secondary | ICD-10-CM | POA: Diagnosis not present

## 2019-04-02 DIAGNOSIS — E538 Deficiency of other specified B group vitamins: Secondary | ICD-10-CM | POA: Diagnosis not present

## 2019-04-02 DIAGNOSIS — M4800 Spinal stenosis, site unspecified: Secondary | ICD-10-CM | POA: Diagnosis not present

## 2019-04-02 DIAGNOSIS — F419 Anxiety disorder, unspecified: Secondary | ICD-10-CM | POA: Diagnosis not present

## 2019-04-02 DIAGNOSIS — Z23 Encounter for immunization: Secondary | ICD-10-CM | POA: Diagnosis not present

## 2019-04-02 DIAGNOSIS — M549 Dorsalgia, unspecified: Secondary | ICD-10-CM | POA: Diagnosis not present

## 2019-04-02 DIAGNOSIS — K219 Gastro-esophageal reflux disease without esophagitis: Secondary | ICD-10-CM | POA: Diagnosis not present

## 2019-04-02 DIAGNOSIS — M8580 Other specified disorders of bone density and structure, unspecified site: Secondary | ICD-10-CM | POA: Diagnosis not present

## 2019-04-04 DIAGNOSIS — E871 Hypo-osmolality and hyponatremia: Secondary | ICD-10-CM | POA: Diagnosis not present

## 2019-04-10 DIAGNOSIS — E871 Hypo-osmolality and hyponatremia: Secondary | ICD-10-CM | POA: Diagnosis not present

## 2019-04-11 DIAGNOSIS — R633 Feeding difficulties: Secondary | ICD-10-CM | POA: Diagnosis not present

## 2019-04-11 DIAGNOSIS — Z811 Family history of alcohol abuse and dependence: Secondary | ICD-10-CM | POA: Diagnosis not present

## 2019-04-11 DIAGNOSIS — F329 Major depressive disorder, single episode, unspecified: Secondary | ICD-10-CM | POA: Diagnosis not present

## 2019-04-11 DIAGNOSIS — J9600 Acute respiratory failure, unspecified whether with hypoxia or hypercapnia: Secondary | ICD-10-CM | POA: Diagnosis not present

## 2019-04-11 DIAGNOSIS — F17200 Nicotine dependence, unspecified, uncomplicated: Secondary | ICD-10-CM | POA: Diagnosis not present

## 2019-04-11 DIAGNOSIS — M5416 Radiculopathy, lumbar region: Secondary | ICD-10-CM | POA: Diagnosis not present

## 2019-04-11 DIAGNOSIS — D500 Iron deficiency anemia secondary to blood loss (chronic): Secondary | ICD-10-CM | POA: Diagnosis not present

## 2019-04-11 DIAGNOSIS — D62 Acute posthemorrhagic anemia: Secondary | ICD-10-CM | POA: Diagnosis present

## 2019-04-11 DIAGNOSIS — M5415 Radiculopathy, thoracolumbar region: Secondary | ICD-10-CM | POA: Diagnosis not present

## 2019-04-11 DIAGNOSIS — Z79891 Long term (current) use of opiate analgesic: Secondary | ICD-10-CM | POA: Diagnosis not present

## 2019-04-11 DIAGNOSIS — M48061 Spinal stenosis, lumbar region without neurogenic claudication: Secondary | ICD-10-CM | POA: Diagnosis present

## 2019-04-11 DIAGNOSIS — R000 Tachycardia, unspecified: Secondary | ICD-10-CM | POA: Diagnosis present

## 2019-04-11 DIAGNOSIS — M960 Pseudarthrosis after fusion or arthrodesis: Secondary | ICD-10-CM | POA: Diagnosis present

## 2019-04-11 DIAGNOSIS — M5417 Radiculopathy, lumbosacral region: Secondary | ICD-10-CM | POA: Diagnosis not present

## 2019-04-11 DIAGNOSIS — E222 Syndrome of inappropriate secretion of antidiuretic hormone: Secondary | ICD-10-CM | POA: Diagnosis present

## 2019-04-11 DIAGNOSIS — M961 Postlaminectomy syndrome, not elsewhere classified: Secondary | ICD-10-CM | POA: Diagnosis not present

## 2019-04-11 DIAGNOSIS — F17290 Nicotine dependence, other tobacco product, uncomplicated: Secondary | ICD-10-CM | POA: Diagnosis present

## 2019-04-11 DIAGNOSIS — E871 Hypo-osmolality and hyponatremia: Secondary | ICD-10-CM | POA: Diagnosis not present

## 2019-04-11 DIAGNOSIS — J9811 Atelectasis: Secondary | ICD-10-CM | POA: Diagnosis not present

## 2019-04-11 DIAGNOSIS — E861 Hypovolemia: Secondary | ICD-10-CM | POA: Diagnosis present

## 2019-04-11 DIAGNOSIS — M419 Scoliosis, unspecified: Secondary | ICD-10-CM | POA: Diagnosis not present

## 2019-04-11 DIAGNOSIS — I10 Essential (primary) hypertension: Secondary | ICD-10-CM | POA: Diagnosis present

## 2019-04-11 DIAGNOSIS — M4036 Flatback syndrome, lumbar region: Secondary | ICD-10-CM | POA: Diagnosis not present

## 2019-04-11 DIAGNOSIS — G8928 Other chronic postprocedural pain: Secondary | ICD-10-CM | POA: Diagnosis present

## 2019-04-11 DIAGNOSIS — R739 Hyperglycemia, unspecified: Secondary | ICD-10-CM | POA: Diagnosis present

## 2019-04-11 DIAGNOSIS — E782 Mixed hyperlipidemia: Secondary | ICD-10-CM | POA: Diagnosis present

## 2019-04-11 DIAGNOSIS — R451 Restlessness and agitation: Secondary | ICD-10-CM | POA: Diagnosis present

## 2019-04-11 DIAGNOSIS — G8918 Other acute postprocedural pain: Secondary | ICD-10-CM | POA: Diagnosis not present

## 2019-04-11 HISTORY — PX: BACK SURGERY: SHX140

## 2019-04-11 LAB — CBC AND DIFFERENTIAL
HCT: 37 (ref 36–46)
Hemoglobin: 12.6 (ref 12.0–16.0)
Platelets: 391 (ref 150–399)
WBC: 5.5

## 2019-04-11 LAB — BASIC METABOLIC PANEL
BUN: 9 (ref 4–21)
Creatinine: 0.8 (ref 0.5–1.1)
Glucose: 99
Potassium: 4.4 (ref 3.4–5.3)
Sodium: 135 — AB (ref 137–147)

## 2019-04-11 LAB — PROTIME-INR: Protime: 11.6 (ref 10.0–13.8)

## 2019-04-11 LAB — HEMOGLOBIN A1C: Hemoglobin A1C: 5.6

## 2019-04-11 LAB — HEPATIC FUNCTION PANEL
ALT: 21 (ref 7–35)
AST: 22 (ref 13–35)
Alkaline Phosphatase: 88 (ref 25–125)
Bilirubin, Total: 0.4

## 2019-04-11 LAB — POCT INR: INR: 1 (ref 0.9–1.1)

## 2019-04-12 ENCOUNTER — Telehealth: Payer: Self-pay | Admitting: Family Medicine

## 2019-04-12 NOTE — Telephone Encounter (Signed)
See note

## 2019-04-12 NOTE — Telephone Encounter (Signed)
Copied from [place] [id number]. Topic: General - Other >> Apr 12, 2019  2:19 PM Keene Breath wrote: Reason for CRM: Called to request a referral from the doctor for Home Care.  They indicated that they are faxing information to the doctor as well.  CB# [phone]

## 2019-04-15 NOTE — Telephone Encounter (Signed)
Adonis Huguenin, case manager from [facility] called to see if Dr. Jonni Sanger got the Documentation sent on Friday for the home care referral / please give her a call and advise her if the paperwork was received. / please call her at [phone]

## 2019-04-15 NOTE — Telephone Encounter (Signed)
Called and let Adonis Huguenin know that we received the paperwork.

## 2019-04-15 NOTE — Telephone Encounter (Signed)
See note

## 2019-04-16 ENCOUNTER — Other Ambulatory Visit: Payer: Self-pay

## 2019-04-16 DIAGNOSIS — Z9889 Other specified postprocedural states: Secondary | ICD-10-CM

## 2019-04-16 NOTE — Telephone Encounter (Signed)
I have spoken with Adonis Huguenin.

## 2019-04-16 NOTE — Telephone Encounter (Addendum)
Shannon Young is calling back and would like amber to return her call

## 2019-04-16 NOTE — Telephone Encounter (Signed)
[facility] called back and stated that they have to have these order before they can discharge the pt.  Please advise  Best number [phone]

## 2019-04-16 NOTE — Telephone Encounter (Signed)
See note

## 2019-04-17 ENCOUNTER — Ambulatory Visit (INDEPENDENT_AMBULATORY_CARE_PROVIDER_SITE_OTHER): Payer: Medicare Other | Admitting: Family Medicine

## 2019-04-17 ENCOUNTER — Encounter: Payer: Self-pay | Admitting: Family Medicine

## 2019-04-17 DIAGNOSIS — Z9889 Other specified postprocedural states: Secondary | ICD-10-CM

## 2019-04-17 DIAGNOSIS — M961 Postlaminectomy syndrome, not elsewhere classified: Secondary | ICD-10-CM

## 2019-04-17 DIAGNOSIS — F1129 Opioid dependence with unspecified opioid-induced disorder: Secondary | ICD-10-CM | POA: Diagnosis not present

## 2019-04-17 DIAGNOSIS — G894 Chronic pain syndrome: Secondary | ICD-10-CM | POA: Diagnosis not present

## 2019-04-17 MED ORDER — SODIUM CHLORIDE (PF) 0.9 % IJ SOLN
10.00 | INTRAMUSCULAR | Status: DC
Start: ? — End: 2019-04-17

## 2019-04-17 MED ORDER — VITAMIN B-12 500 MCG PO TABS
1000.00 | ORAL_TABLET | ORAL | Status: DC
Start: 2019-04-17 — End: 2019-04-17

## 2019-04-17 MED ORDER — ATORVASTATIN CALCIUM 20 MG PO TABS
20.00 | ORAL_TABLET | ORAL | Status: DC
Start: 2019-04-16 — End: 2019-04-17

## 2019-04-17 MED ORDER — PREGABALIN 75 MG PO CAPS
75.00 | ORAL_CAPSULE | ORAL | Status: DC
Start: 2019-04-16 — End: 2019-04-17

## 2019-04-17 MED ORDER — SODIUM CHLORIDE (PF) 0.9 % IJ SOLN
0.50 | INTRAMUSCULAR | Status: DC
Start: ? — End: 2019-04-17

## 2019-04-17 MED ORDER — THIAMINE HCL 100 MG PO TABS
200.00 | ORAL_TABLET | ORAL | Status: DC
Start: 2019-04-17 — End: 2019-04-17

## 2019-04-17 MED ORDER — OXYCODONE HCL 15 MG PO TABS
60.00 | ORAL_TABLET | ORAL | Status: DC
Start: 2019-04-16 — End: 2019-04-17

## 2019-04-17 MED ORDER — FOLIC ACID 1 MG PO TABS
1.00 | ORAL_TABLET | ORAL | Status: DC
Start: 2019-04-17 — End: 2019-04-17

## 2019-04-17 MED ORDER — LOSARTAN POTASSIUM 50 MG PO TABS
100.00 | ORAL_TABLET | ORAL | Status: DC
Start: 2019-04-17 — End: 2019-04-17

## 2019-04-17 MED ORDER — ACETAMINOPHEN 325 MG PO TABS
975.00 | ORAL_TABLET | ORAL | Status: DC
Start: 2019-04-16 — End: 2019-04-17

## 2019-04-17 MED ORDER — POTASSIUM CHLORIDE CRYS ER 20 MEQ PO TBCR
40.00 | EXTENDED_RELEASE_TABLET | ORAL | Status: DC
Start: 2019-04-17 — End: 2019-04-17

## 2019-04-17 MED ORDER — ONDANSETRON HCL 4 MG/2ML IJ SOLN
4.00 | INTRAMUSCULAR | Status: DC
Start: ? — End: 2019-04-17

## 2019-04-17 MED ORDER — LIDOCAINE 5 % EX PTCH
2.00 | MEDICATED_PATCH | CUTANEOUS | Status: DC
Start: 2019-04-17 — End: 2019-04-17

## 2019-04-17 MED ORDER — KINERASE EX
15.00 | CUTANEOUS | Status: DC
Start: ? — End: 2019-04-17

## 2019-04-17 MED ORDER — DIAZEPAM 5 MG PO TABS
5.00 | ORAL_TABLET | ORAL | Status: DC
Start: 2019-04-16 — End: 2019-04-17

## 2019-04-17 MED ORDER — GENERIC EXTERNAL MEDICATION
20.00 | Status: DC
Start: ? — End: 2019-04-17

## 2019-04-17 MED ORDER — DOCUSATE SODIUM 100 MG PO CAPS
100.00 | ORAL_CAPSULE | ORAL | Status: DC
Start: 2019-04-16 — End: 2019-04-17

## 2019-04-17 MED ORDER — SODIUM CHLORIDE 1 G PO TABS
2.00 | ORAL_TABLET | ORAL | Status: DC
Start: 2019-04-16 — End: 2019-04-17

## 2019-04-17 MED ORDER — HEPARIN SODIUM (PORCINE) 5000 UNIT/ML IJ SOLN
5000.00 | INTRAMUSCULAR | Status: DC
Start: 2019-04-16 — End: 2019-04-17

## 2019-04-17 MED ORDER — DULOXETINE HCL 60 MG PO CPEP
60.00 | ORAL_CAPSULE | ORAL | Status: DC
Start: 2019-04-17 — End: 2019-04-17

## 2019-04-17 MED ORDER — LANSOPRAZOLE 30 MG PO TBDD
30.00 | DELAYED_RELEASE_TABLET | ORAL | Status: DC
Start: 2019-04-16 — End: 2019-04-17

## 2019-04-17 MED ORDER — SENNA 8.6 MG PO TABS
8.60 | ORAL_TABLET | ORAL | Status: DC
Start: 2019-04-16 — End: 2019-04-17

## 2019-04-17 MED ORDER — FP GAS RELIEF 125 MG PO CAPS
1.00 | ORAL_CAPSULE | ORAL | Status: DC
Start: 2019-04-18 — End: 2019-04-17

## 2019-04-17 NOTE — Progress Notes (Signed)
Virtual Visit via Video Note  Subjective  CC:  Chief Complaint  Patient presents with  . Face to Face     I connected with Geniene Escobedo on 04/17/19 at 11:20 AM EST by a video enabled telemedicine application and verified that I am speaking with the correct person using two identifiers. Location patient: Home Location provider: [facility], Office Persons participating in the virtual visit: Rotem Lijewski, Leamon Arnt, MD Gertie Exon, CMA  I discussed the limitations of evaluation and management by telemedicine and the availability of in person appointments. The patient expressed understanding and agreed to proceed. HPI: Shannon Young is a 63 y.o. female who was contacted today to address the problems listed above in the chief complaint. Marland Kitchen POD # 6 lumbar laminectomy and revision of pelvic fusion: see notes in Care Everywhere. I reviewed hospital records. Needs Home Health care: PT/OT, however she is currently in [place]. They moved back to [place] in October due to job change. Since, her husband has been recruited back to [facility] and they are returning the first week of December. She has reestablished with her prior PCP in [place]. She will need PT/OT and suture removal. We did not identify any nursing needs.   Assessment  1. Status post laminectomy   2. Postlaminectomy syndrome, not elsewhere classified   3. Chronic pain syndrome   4. Opioid dependence with opioid-induced disorder [facility])      Plan   Post op care/PT/OT:  Recommend pt [facility]. I have placed [facility] PT/OT order for her to have once she moves back in a few weeks.   She will f/u with me afterwards.   I discussed the assessment and treatment plan with the patient. The patient was provided an opportunity to ask questions and all were answered. The patient agreed with the plan and demonstrated an understanding of the instructions.   The patient was advised to  call back or seek an in-person evaluation if the symptoms worsen or if the condition fails to improve as anticipated. Follow up: 4-6 weeks for recheck  Visit date not found  No orders of the defined types were placed in this encounter.     I reviewed the patients updated PMH, FH, and SocHx.    Patient Active Problem List   Diagnosis Date Noted  . Recurrent major depressive disorder, in partial remission ([place]) 06/15/2018    Priority: High  . Alcohol dependence in remission ([place]) 10/17/2017    Priority: High  . Insomnia secondary to chronic pain 10/17/2017    Priority: High  . Spinal stenosis of lumbar region 10/17/2017    Priority: High  . Alcoholic hepatitis Q000111Q    Priority: High  . Essential hypertension 06/29/2015    Priority: High  . Chronic pain syndrome 06/01/2010    Priority: High  . Opioid dependence ([place]) 06/01/2010    Priority: High  . Postlaminectomy syndrome, not elsewhere classified 06/01/2010    Priority: High  . DDD (degenerative disc disease), lumbosacral 03/19/2010    Priority: High  . Family history of ischemic heart disease (IHD) 06/29/2015    Priority: Medium  . GERD (gastroesophageal reflux disease) 05/10/2013    Priority: Medium  . Nonrheumatic mitral valve regurgitation 06/29/2015    Priority: Low  . SIADH (syndrome of inappropriate ADH production) ([place]) 01/17/2019  . Incontinence of feces   . Tubular adenoma of colon 08/24/2018  .  Urinary incontinence 02/27/2018  . Paresthesia 02/27/2018  . Memory loss 11/23/2017  . Major depression, recurrent, chronic ([place]) 10/17/2017  . Delirium tremens ([place]) 10/17/2017  . Orthostatic hypotension 06/29/2015   Current Meds  Medication Sig  . atorvastatin (LIPITOR) 20 MG tablet TAKE 1 TABLET BY MOUTH AT BEDTIME  . diazepam (VALIUM) 5 MG tablet Take by mouth.  Mariane Baumgarten Sodium (DSS) 100 MG CAPS Take by mouth.  . DULoxetine (CYMBALTA) 60 MG capsule Take 1 capsule (60 mg total) by mouth daily.  .  fentaNYL (DURAGESIC) 75 MCG/HR Place 1 patch onto the skin every 3 (three) days.  . fluticasone (FLONASE) 50 MCG/ACT nasal spray Place 1 spray into both nostrils as needed.   . folic acid (FOLVITE) 1 MG tablet Take by mouth.  . Glucosamine 500 MG CAPS Take by mouth 2 (two) times daily.  Marland Kitchen ibuprofen (ADVIL) 800 MG tablet Take 1 tablet (800 mg total) by mouth every 8 (eight) hours as needed.  Marland Kitchen losartan (COZAAR) 100 MG tablet Take 1 tablet by mouth once daily  . mirabegron ER (MYRBETRIQ) 25 MG TB24 tablet Take 25 mg by mouth daily.  . Multiple Vitamin (MULTIVITAMIN) capsule Take by mouth daily.   . naloxone (NARCAN) nasal spray 4 mg/0.1 mL Spray into one nostril for suspected overdose. Repeat x1, if no response after 3 minutes.  Dispense one package with 2 nasal sprays.  . ondansetron (ZOFRAN-ODT) 4 MG disintegrating tablet DISSOLVE 1 TABLET IN MOUTH EVERY 8 HOURS AS NEEDED FOR NAUSEA FOR VOMITING  . OXcarbazepine (TRILEPTAL) 150 MG tablet Take 300 mg by mouth 2 (two) times daily.   Marland Kitchen oxycodone (ROXICODONE) 30 MG immediate release tablet Take 60 mg by mouth 4 (four) times daily.   . Oxycodone HCl 20 MG TABS Take 20 mg by mouth every 4 (four) hours as needed.  . pantoprazole (PROTONIX) 40 MG tablet Take 1 tablet by mouth twice daily  . pregabalin (LYRICA) 75 MG capsule Take by mouth.  . senna (SENOKOT) 8.6 MG TABS tablet Take by mouth.  . sodium chloride 1 g tablet Take 2 tablets (2 g total) by mouth daily.  Marland Kitchen thiamine 100 MG tablet Take by mouth.  . traZODone (DESYREL) 50 MG tablet TAKE 1 TABLET BY MOUTH AT BEDTIME  . vitamin B-12 (CYANOCOBALAMIN) 500 MCG tablet Take 500 mcg by mouth daily.    Allergies: Patient is allergic to bupropion and gabapentin. Family History: Patient family history includes Alcohol abuse in her brother; Colon cancer in her sister; Healthy in her daughter, daughter, daughter, and son; Hyperlipidemia in her father; Hypertension in her brother, father, and mother.  Social History:  Patient  reports that she has been smoking e-cigarettes. She has never used smokeless tobacco. She reports previous alcohol use. She reports that she does not use drugs.  Review of Systems: Constitutional: Negative for fever malaise or anorexia Cardiovascular: negative for chest pain Respiratory: negative for SOB or persistent cough Gastrointestinal: negative for abdominal pain  OBJECTIVE Vitals: There were no vitals taken for this visit. General: no acute distress , A&Ox3, looks good  Leamon Arnt, MD

## 2019-04-18 ENCOUNTER — Telehealth: Payer: Self-pay

## 2019-04-18 NOTE — Telephone Encounter (Signed)
Copied from [place] [phone]. Topic: General - Other >> Apr 18, 2019  9:27 AM Leward Quan A wrote: Reason for CRM: Caryl Pina with [facility] called to request from Dr Jonni Sanger the patients demographics, insurance information, and office visit notes from 04/17/2019 visit. Caryl Pina can be reached at Ph# [phone] and  fax# [phone]

## 2019-04-18 NOTE — Telephone Encounter (Signed)
Requested information has been faxed to [facility].

## 2019-04-20 DIAGNOSIS — R413 Other amnesia: Secondary | ICD-10-CM | POA: Diagnosis not present

## 2019-04-20 DIAGNOSIS — G629 Polyneuropathy, unspecified: Secondary | ICD-10-CM | POA: Diagnosis not present

## 2019-04-20 DIAGNOSIS — I10 Essential (primary) hypertension: Secondary | ICD-10-CM | POA: Diagnosis not present

## 2019-04-20 DIAGNOSIS — Z981 Arthrodesis status: Secondary | ICD-10-CM | POA: Diagnosis not present

## 2019-04-20 DIAGNOSIS — I340 Nonrheumatic mitral (valve) insufficiency: Secondary | ICD-10-CM | POA: Diagnosis not present

## 2019-04-20 DIAGNOSIS — Z4802 Encounter for removal of sutures: Secondary | ICD-10-CM | POA: Diagnosis not present

## 2019-04-20 DIAGNOSIS — Z79891 Long term (current) use of opiate analgesic: Secondary | ICD-10-CM | POA: Diagnosis not present

## 2019-04-20 DIAGNOSIS — G894 Chronic pain syndrome: Secondary | ICD-10-CM | POA: Diagnosis not present

## 2019-04-20 DIAGNOSIS — Z4789 Encounter for other orthopedic aftercare: Secondary | ICD-10-CM | POA: Diagnosis not present

## 2019-04-20 DIAGNOSIS — F3341 Major depressive disorder, recurrent, in partial remission: Secondary | ICD-10-CM | POA: Diagnosis not present

## 2019-04-20 DIAGNOSIS — K219 Gastro-esophageal reflux disease without esophagitis: Secondary | ICD-10-CM | POA: Diagnosis not present

## 2019-04-20 DIAGNOSIS — E222 Syndrome of inappropriate secretion of antidiuretic hormone: Secondary | ICD-10-CM | POA: Diagnosis not present

## 2019-04-20 DIAGNOSIS — K7010 Alcoholic hepatitis without ascites: Secondary | ICD-10-CM | POA: Diagnosis not present

## 2019-04-20 DIAGNOSIS — I951 Orthostatic hypotension: Secondary | ICD-10-CM | POA: Diagnosis not present

## 2019-04-20 DIAGNOSIS — F17210 Nicotine dependence, cigarettes, uncomplicated: Secondary | ICD-10-CM | POA: Diagnosis not present

## 2019-04-22 DIAGNOSIS — M549 Dorsalgia, unspecified: Secondary | ICD-10-CM | POA: Diagnosis not present

## 2019-04-22 DIAGNOSIS — I10 Essential (primary) hypertension: Secondary | ICD-10-CM | POA: Diagnosis not present

## 2019-04-22 DIAGNOSIS — M4800 Spinal stenosis, site unspecified: Secondary | ICD-10-CM | POA: Diagnosis not present

## 2019-04-22 DIAGNOSIS — Z0189 Encounter for other specified special examinations: Secondary | ICD-10-CM | POA: Diagnosis not present

## 2019-04-23 DIAGNOSIS — G894 Chronic pain syndrome: Secondary | ICD-10-CM | POA: Diagnosis not present

## 2019-04-23 DIAGNOSIS — M5440 Lumbago with sciatica, unspecified side: Secondary | ICD-10-CM | POA: Diagnosis not present

## 2019-04-23 DIAGNOSIS — M961 Postlaminectomy syndrome, not elsewhere classified: Secondary | ICD-10-CM | POA: Diagnosis not present

## 2019-04-23 NOTE — Telephone Encounter (Signed)
See below

## 2019-04-23 NOTE — Telephone Encounter (Signed)
Caryl Pina called again stating she is still needing all this information as well as office notes from 04/17/2019. Please advise and fax to [phone].

## 2019-04-24 ENCOUNTER — Other Ambulatory Visit: Payer: Self-pay

## 2019-04-24 ENCOUNTER — Encounter: Payer: Self-pay | Admitting: Family Medicine

## 2019-04-24 LAB — TYPE AND SCREEN: Type and Screen: O POS

## 2019-04-24 NOTE — Telephone Encounter (Signed)
I have faxed the documents again.  Received confirmation page.

## 2019-04-29 ENCOUNTER — Telehealth: Payer: Self-pay | Admitting: Family Medicine

## 2019-04-29 NOTE — Telephone Encounter (Signed)
Patient called in stating she would like to have staples removed Dec2nd. Please advise.

## 2019-04-29 NOTE — Telephone Encounter (Signed)
See note

## 2019-04-29 NOTE — Telephone Encounter (Signed)
Copied from [place] [phone]. Topic: Appointment Scheduling - Scheduling Inquiry for Clinic >> Apr 29, 2019 11:30 AM Lennox Solders wrote: Reason for CRM: pt had back surgery in [place] on nov 12 nd needs staples removed on dec 3 or dec 4. Dr Jonni Sanger has same day slot

## 2019-04-30 ENCOUNTER — Ambulatory Visit: Payer: Medicare Other | Admitting: Internal Medicine

## 2019-05-07 ENCOUNTER — Ambulatory Visit (INDEPENDENT_AMBULATORY_CARE_PROVIDER_SITE_OTHER): Payer: Medicare Other | Admitting: Family Medicine

## 2019-05-07 ENCOUNTER — Other Ambulatory Visit: Payer: Self-pay

## 2019-05-07 ENCOUNTER — Encounter: Payer: Self-pay | Admitting: Family Medicine

## 2019-05-07 VITALS — BP 124/74 | HR 97 | Temp 98.1°F | Ht 65.0 in | Wt 151.0 lb

## 2019-05-07 DIAGNOSIS — Z981 Arthrodesis status: Secondary | ICD-10-CM

## 2019-05-07 DIAGNOSIS — G894 Chronic pain syndrome: Secondary | ICD-10-CM | POA: Diagnosis not present

## 2019-05-07 DIAGNOSIS — E222 Syndrome of inappropriate secretion of antidiuretic hormone: Secondary | ICD-10-CM

## 2019-05-07 DIAGNOSIS — N390 Urinary tract infection, site not specified: Secondary | ICD-10-CM | POA: Diagnosis not present

## 2019-05-07 DIAGNOSIS — Z4802 Encounter for removal of sutures: Secondary | ICD-10-CM | POA: Diagnosis not present

## 2019-05-07 DIAGNOSIS — F1129 Opioid dependence with unspecified opioid-induced disorder: Secondary | ICD-10-CM

## 2019-05-07 NOTE — Progress Notes (Signed)
Subjective  CC:  Chief Complaint  Patient presents with  . Suture / Staple Removal    HPI: Shannon Young is a 63 y.o. female who presents to the office today to address the problems listed above in the chief complaint.  S/p lumbar laminectomy and spinal fusion 11/12: due staple removal. No problems. Still with pain from surgery. Had a few weeks of PT in [place]; now in process of moving back to [place]. Looking for a home. Living in a hotel. Needs to restart PT when she can: requests [facility].  H/o hyponatermia; stable while in hospital and no sxs now  H/o recurrent uti: would like urine rechecked. No sxs now  Chronic pain on chronic narcotics   Assessment  1. Status post laminectomy with spinal fusion   2. Encounter for staple removal   3. Urinary tract infection without hematuria, site unspecified   4. Chronic pain syndrome   5. Opioid dependence with opioid-induced disorder ([place])   6. SIADH (syndrome of inappropriate ADH production) ([place])      Plan   S/p back surgeyr:  Need PT: referral placed. Well healed surgical scar; >50 staples removed w/o complications.   Chronic pain per pain mgt  SIADH: reassured. Will recheck in 3 months  H/o UTI: recheck culure today  Follow up: Return in about 3 months (around 08/05/2019) for follow up on.  Visit date not found  Orders Placed This Encounter  Procedures  . Urine culture  . Ambulatory referral to Physical Therapy   No orders of the defined types were placed in this encounter.     I reviewed the patients updated PMH, FH, and SocHx.    Patient Active Problem List   Diagnosis Date Noted  . Recurrent major depressive disorder, in partial remission ([place]) 06/15/2018    Priority: High  . Alcohol dependence in remission ([place]) 10/17/2017    Priority: High  . Insomnia secondary to chronic pain 10/17/2017    Priority: High  . Spinal stenosis of lumbar region 10/17/2017    Priority: High  . Alcoholic hepatitis [id number]   Priority: High  . Essential hypertension 06/29/2015    Priority: High  . Chronic pain syndrome 06/01/2010    Priority: High  . Opioid dependence ([place]) 06/01/2010    Priority: High  . Postlaminectomy syndrome, not elsewhere classified 06/01/2010    Priority: High  . DDD (degenerative disc disease), lumbosacral 03/19/2010    Priority: High  . Family history of ischemic heart disease (IHD) 06/29/2015    Priority: Medium  . GERD (gastroesophageal reflux disease) 05/10/2013    Priority: Medium  . Nonrheumatic mitral valve regurgitation 06/29/2015    Priority: Low  . SIADH (syndrome of inappropriate ADH production) ([place]) 01/17/2019  . Incontinence of feces   . Tubular adenoma of colon 08/24/2018  . Urinary incontinence 02/27/2018  . Paresthesia 02/27/2018  . Memory loss 11/23/2017  . Major depression, recurrent, chronic ([place]) 10/17/2017  . Delirium tremens ([place]) 10/17/2017  . Orthostatic hypotension 06/29/2015   Current Meds  Medication Sig  . atorvastatin (LIPITOR) 20 MG tablet TAKE 1 TABLET BY MOUTH AT BEDTIME  . diazepam (VALIUM) 5 MG tablet Take by mouth.  . DULoxetine (CYMBALTA) 60 MG capsule Take 1 capsule (60 mg total) by mouth daily.  . fentaNYL (DURAGESIC) 75 MCG/HR Place 1 patch onto the skin every 3 (three) days.  . fluticasone (FLONASE) 50 MCG/ACT nasal spray Place 1 spray into both nostrils as needed.   . folic acid (FOLVITE)  1 MG tablet Take by mouth.  . Glucosamine 500 MG CAPS Take by mouth 2 (two) times daily.  Marland Kitchen ibuprofen (ADVIL) 800 MG tablet Take 1 tablet (800 mg total) by mouth every 8 (eight) hours as needed.  Marland Kitchen losartan (COZAAR) 100 MG tablet Take 1 tablet by mouth once daily  . mirabegron ER (MYRBETRIQ) 25 MG TB24 tablet Take 25 mg by mouth daily.  . Multiple Vitamin (MULTIVITAMIN) capsule Take by mouth daily.   . naloxone (NARCAN) nasal spray 4 mg/0.1 mL Spray into one nostril for suspected overdose. Repeat x1, if no response after 3 minutes.  Dispense one  package with 2 nasal sprays.  . ondansetron (ZOFRAN-ODT) 4 MG disintegrating tablet DISSOLVE 1 TABLET IN MOUTH EVERY 8 HOURS AS NEEDED FOR NAUSEA FOR VOMITING  . OXcarbazepine (TRILEPTAL) 150 MG tablet Take 300 mg by mouth 2 (two) times daily.   Marland Kitchen oxycodone (ROXICODONE) 30 MG immediate release tablet Take 60 mg by mouth 4 (four) times daily.   . Oxycodone HCl 20 MG TABS Take 20 mg by mouth every 4 (four) hours as needed.  . pantoprazole (PROTONIX) 40 MG tablet Take 1 tablet by mouth twice daily  . pregabalin (LYRICA) 75 MG capsule Take by mouth.  . sodium chloride 1 g tablet Take 2 tablets (2 g total) by mouth daily.  Marland Kitchen thiamine 100 MG tablet Take by mouth.  . traZODone (DESYREL) 50 MG tablet TAKE 1 TABLET BY MOUTH AT BEDTIME  . vitamin B-12 (CYANOCOBALAMIN) 500 MCG tablet Take 500 mcg by mouth daily.    Allergies: Patient is allergic to bupropion and gabapentin. Family History: Patient family history includes Alcohol abuse in her brother; Colon cancer in her sister; Healthy in her daughter, daughter, daughter, and son; Hyperlipidemia in her father; Hypertension in her brother, father, and mother. Social History:  Patient  reports that she has quit smoking. Her smoking use included e-cigarettes. She has never used smokeless tobacco. She reports previous alcohol use. She reports that she does not use drugs.  Review of Systems: Constitutional: Negative for fever malaise or anorexia Cardiovascular: negative for chest pain Respiratory: negative for SOB or persistent cough Gastrointestinal: negative for abdominal pain  Objective  Vitals: BP 124/74 (BP Location: Left Arm, Patient Position: Sitting, Cuff Size: Normal)   Pulse 97   Temp 98.1 F (36.7 C) (Temporal)   Ht 5\' 5"  (1.651 m)   Wt 151 lb (68.5 kg)   SpO2 100%   BMI 25.13 kg/m  General: no acute distress , A&Ox3, normal cognition Able to get to table unassisted Back: approx 14 inch well healed closed dry incision with staples in  place. No drainage or ttp.  Cardiovascular:  RRR without murmur or gallop.  Respiratory:  Good breath sounds bilaterally, CTAB with normal respiratory effort Skin:  Warm, no rashes ABD: no suprapubic ttp. Soft, nl BS     Commons side effects, risks, benefits, and alternatives for medications and treatment plan prescribed today were discussed, and the patient expressed understanding of the given instructions. Patient is instructed to call or message via MyChart if he/she has any questions or concerns regarding our treatment plan. No barriers to understanding were identified. We discussed Red Flag symptoms and signs in detail. Patient expressed understanding regarding what to do in case of urgent or emergency type symptoms.   Medication list was reconciled, printed and provided to the patient in AVS. Patient instructions and summary information was reviewed with the patient as documented in the AVS. This  note was prepared with assistance of Systems analyst. Occasional wrong-word or sound-a-like substitutions may have occurred due to the inherent limitations of voice recognition software  This visit occurred during the SARS-CoV-2 public health emergency.  Safety protocols were in place, including screening questions prior to the visit, additional usage of staff PPE, and extensive cleaning of exam room while observing appropriate contact time as indicated for disinfecting solutions.

## 2019-05-09 LAB — URINE CULTURE
MICRO NUMBER:: 1176519
SPECIMEN QUALITY:: ADEQUATE

## 2019-05-14 DIAGNOSIS — F17210 Nicotine dependence, cigarettes, uncomplicated: Secondary | ICD-10-CM

## 2019-05-14 DIAGNOSIS — F3341 Major depressive disorder, recurrent, in partial remission: Secondary | ICD-10-CM

## 2019-05-14 DIAGNOSIS — I340 Nonrheumatic mitral (valve) insufficiency: Secondary | ICD-10-CM

## 2019-05-14 DIAGNOSIS — G629 Polyneuropathy, unspecified: Secondary | ICD-10-CM | POA: Diagnosis not present

## 2019-05-14 DIAGNOSIS — K7010 Alcoholic hepatitis without ascites: Secondary | ICD-10-CM

## 2019-05-14 DIAGNOSIS — K219 Gastro-esophageal reflux disease without esophagitis: Secondary | ICD-10-CM

## 2019-05-14 DIAGNOSIS — E222 Syndrome of inappropriate secretion of antidiuretic hormone: Secondary | ICD-10-CM

## 2019-05-14 DIAGNOSIS — R413 Other amnesia: Secondary | ICD-10-CM

## 2019-05-14 DIAGNOSIS — Z4789 Encounter for other orthopedic aftercare: Secondary | ICD-10-CM | POA: Diagnosis not present

## 2019-05-14 DIAGNOSIS — Z981 Arthrodesis status: Secondary | ICD-10-CM

## 2019-05-14 DIAGNOSIS — I10 Essential (primary) hypertension: Secondary | ICD-10-CM

## 2019-05-14 DIAGNOSIS — G894 Chronic pain syndrome: Secondary | ICD-10-CM | POA: Diagnosis not present

## 2019-05-14 DIAGNOSIS — Z79891 Long term (current) use of opiate analgesic: Secondary | ICD-10-CM

## 2019-05-14 DIAGNOSIS — Z4802 Encounter for removal of sutures: Secondary | ICD-10-CM

## 2019-05-14 DIAGNOSIS — I951 Orthostatic hypotension: Secondary | ICD-10-CM | POA: Diagnosis not present

## 2019-05-16 ENCOUNTER — Ambulatory Visit (INDEPENDENT_AMBULATORY_CARE_PROVIDER_SITE_OTHER): Payer: Medicare Other

## 2019-05-16 ENCOUNTER — Other Ambulatory Visit: Payer: Self-pay

## 2019-05-16 DIAGNOSIS — Z0000 Encounter for general adult medical examination without abnormal findings: Secondary | ICD-10-CM | POA: Diagnosis not present

## 2019-05-16 NOTE — Patient Instructions (Signed)
Shannon Young , Thank you for taking time to come for your Medicare Wellness Visit. I appreciate your ongoing commitment to your health goals. Please review the following plan we discussed and let me know if I can assist you in the future.   Screening recommendations/referrals: Colorectal Screening: up to date; colonoscopy 08/07/18 Mammogram: recommended; orders placed 01/07/19 Bone Density: Starting at age 61  Vision and Dental Exams: Recommended annual ophthalmology exams for early detection of glaucoma and other disorders of the eye Recommended annual dental exams for proper oral hygiene  Vaccinations: Influenza vaccine: completed 01/17/19 Pneumococcal vaccine: starting at age 31 Tdap vaccine: up to date; last 05/2015  Shingles vaccine: Please call your insurance company to determine your out of pocket expense for the Shingrix vaccine. You may receive this vaccine at your local pharmacy.  Advanced directives:  I have provided a copy for you to complete at home and have notarized. Once this is complete please bring a copy in to our office so we can scan it into your chart.  Goals: Recommend to drink at least 6-8 8oz glasses of water per day and consume a balanced diet rich in fresh fruits and vegetables.   Next appointment: Please schedule your Annual Wellness Visit with your Nurse Health Advisor in one year.  Preventive Care 40-64 Years, Female Preventive care refers to lifestyle choices and visits with your health care provider that can promote health and wellness. What does preventive care include?  A yearly physical exam. This is also called an annual well check.  Dental exams once or twice a year.  Routine eye exams. Ask your health care provider how often you should have your eyes checked.  Personal lifestyle choices, including:  Daily care of your teeth and gums.  Regular physical activity.  Eating a healthy diet.  Avoiding tobacco and drug use.  Limiting alcohol  use.  Practicing safe sex.  Taking low-dose aspirin daily starting at age 63 if recommended by your health care provider.  Taking vitamin and mineral supplements as recommended by your health care provider. What happens during an annual well check? The services and screenings done by your health care provider during your annual well check will depend on your age, overall health, lifestyle risk factors, and family history of disease. Counseling  Your health care provider may ask you questions about your:  Alcohol use.  Tobacco use.  Drug use.  Emotional well-being.  Home and relationship well-being.  Sexual activity.  Eating habits.  Work and work Statistician.  Method of birth control.  Menstrual cycle.  Pregnancy history. Screening  You may have the following tests or measurements:  Height, weight, and BMI.  Blood pressure.  Lipid and cholesterol levels. These may be checked every 5 years, or more frequently if you are over 75 years old.  Skin check.  Lung cancer screening. You may have this screening every year starting at age 86 if you have a 30-pack-year history of smoking and currently smoke or have quit within the past 15 years.  Fecal occult blood test (FOBT) of the stool. You may have this test every year starting at age 68.  Flexible sigmoidoscopy or colonoscopy. You may have a sigmoidoscopy every 5 years or a colonoscopy every 10 years starting at age 91.  Hepatitis C blood test.  Hepatitis B blood test.  Sexually transmitted disease (STD) testing.  Diabetes screening. This is done by checking your blood sugar (glucose) after you have not eaten for a while (fasting).  You may have this done every 1-3 years.  Mammogram. This may be done every 1-2 years. Talk to your health care provider about when you should start having regular mammograms. This may depend on whether you have a family history of breast cancer.  BRCA-related cancer screening. This may  be done if you have a family history of breast, ovarian, tubal, or peritoneal cancers.  Pelvic exam and Pap test. This may be done every 3 years starting at age 21. Starting at age 27, this may be done every 5 years if you have a Pap test in combination with an HPV test.  Bone density scan. This is done to screen for osteoporosis. You may have this scan if you are at high risk for osteoporosis. Discuss your test results, treatment options, and if necessary, the need for more tests with your health care provider. Vaccines  Your health care provider may recommend certain vaccines, such as:  Influenza vaccine. This is recommended every year.  Tetanus, diphtheria, and acellular pertussis (Tdap, Td) vaccine. You may need a Td booster every 10 years.  Zoster vaccine. You may need this after age 63.  Pneumococcal 13-valent conjugate (PCV13) vaccine. You may need this if you have certain conditions and were not previously vaccinated.  Pneumococcal polysaccharide (PPSV23) vaccine. You may need one or two doses if you smoke cigarettes or if you have certain conditions. Talk to your health care provider about which screenings and vaccines you need and how often you need them. This information is not intended to replace advice given to you by your health care provider. Make sure you discuss any questions you have with your health care provider. Document Released: 06/12/2015 Document Revised: 02/03/2016 Document Reviewed: 03/17/2015 Elsevier Interactive Patient Education  2017 [place] Prevention in the Home Falls can cause injuries. They can happen to people of all ages. There are many things you can do to make your home safe and to help prevent falls. What can I do on the outside of my home?  Regularly fix the edges of walkways and driveways and fix any cracks.  Remove anything that might make you trip as you walk through a door, such as a raised step or threshold.  Trim any  bushes or trees on the path to your home.  Use bright outdoor lighting.  Clear any walking paths of anything that might make someone trip, such as rocks or tools.  Regularly check to see if handrails are loose or broken. Make sure that both sides of any steps have handrails.  Any raised decks and porches should have guardrails on the edges.  Have any leaves, snow, or ice cleared regularly.  Use sand or salt on walking paths during winter.  Clean up any spills in your garage right away. This includes oil or grease spills. What can I do in the bathroom?  Use night lights.  Install grab bars by the toilet and in the tub and shower. Do not use towel bars as grab bars.  Use non-skid mats or decals in the tub or shower.  If you need to sit down in the shower, use a plastic, non-slip stool.  Keep the floor dry. Clean up any water that spills on the floor as soon as it happens.  Remove soap buildup in the tub or shower regularly.  Attach bath mats securely with double-sided non-slip rug tape.  Do not have throw rugs and other things on the floor that can make  you trip. What can I do in the bedroom?  Use night lights.  Make sure that you have a light by your bed that is easy to reach.  Do not use any sheets or blankets that are too big for your bed. They should not hang down onto the floor.  Have a firm chair that has side arms. You can use this for support while you get dressed.  Do not have throw rugs and other things on the floor that can make you trip. What can I do in the kitchen?  Clean up any spills right away.  Avoid walking on wet floors.  Keep items that you use a lot in easy-to-reach places.  If you need to reach something above you, use a strong step stool that has a grab bar.  Keep electrical cords out of the way.  Do not use floor polish or wax that makes floors slippery. If you must use wax, use non-skid floor wax.  Do not have throw rugs and other  things on the floor that can make you trip. What can I do with my stairs?  Do not leave any items on the stairs.  Make sure that there are handrails on both sides of the stairs and use them. Fix handrails that are broken or loose. Make sure that handrails are as long as the stairways.  Check any carpeting to make sure that it is firmly attached to the stairs. Fix any carpet that is loose or worn.  Avoid having throw rugs at the top or bottom of the stairs. If you do have throw rugs, attach them to the floor with carpet tape.  Make sure that you have a light switch at the top of the stairs and the bottom of the stairs. If you do not have them, ask someone to add them for you. What else can I do to help prevent falls?  Wear shoes that:  Do not have high heels.  Have rubber bottoms.  Are comfortable and fit you well.  Are closed at the toe. Do not wear sandals.  If you use a stepladder:  Make sure that it is fully opened. Do not climb a closed stepladder.  Make sure that both sides of the stepladder are locked into place.  Ask someone to hold it for you, if possible.  Clearly mark and make sure that you can see:  Any grab bars or handrails.  First and last steps.  Where the edge of each step is.  Use tools that help you move around (mobility aids) if they are needed. These include:  Canes.  Walkers.  Scooters.  Crutches.  Turn on the lights when you go into a dark area. Replace any light bulbs as soon as they burn out.  Set up your furniture so you have a clear path. Avoid moving your furniture around.  If any of your floors are uneven, fix them.  If there are any pets around you, be aware of where they are.  Review your medicines with your doctor. Some medicines can make you feel dizzy. This can increase your chance of falling. Ask your doctor what other things that you can do to help prevent falls. This information is not intended to replace advice given to  you by your health care provider. Make sure you discuss any questions you have with your health care provider. Document Released: 03/12/2009 Document Revised: 10/22/2015 Document Reviewed: 06/20/2014 Elsevier Interactive Patient Education  2017 [employer].

## 2019-05-16 NOTE — Progress Notes (Signed)
This visit is being conducted via phone call due to the COVID-19 pandemic. This patient has given me verbal consent via phone to conduct this visit, patient states they are participating from their home address. Some vital signs may be absent or patient reported.   Patient identification: identified by name, DOB, and current address.  Location provider: [facility], Office Persons participating in the virtual visit: Shannon Young, patient, [name]), and Shannon Young     Subjective:   Shannon Young is a 63 y.o. female who presents for an Initial Medicare Annual Wellness Visit.  Review of Systems     Cardiac Risk Factors include: advanced age (>71men, >2 women);hypertension;smoking/ tobacco exposure    Objective:    There were no vitals filed for this visit. There is no height or weight on file to calculate BMI.  Advanced Directives 05/16/2019 01/11/2019 12/19/2018  Does Patient Have a Medical Advance Directive? No No No  Would patient like information on creating a medical advance directive? Yes (MAU/Ambulatory/Procedural Areas - Information given) - No - Patient declined    Current Medications (verified) Outpatient Encounter Medications as of 05/16/2019  Medication Sig  . atorvastatin (LIPITOR) 20 MG tablet TAKE 1 TABLET BY MOUTH AT BEDTIME  . diazepam (VALIUM) 5 MG tablet Take by mouth.  . DULoxetine (CYMBALTA) 60 MG capsule Take 1 capsule (60 mg total) by mouth daily.  . fentaNYL (DURAGESIC) 75 MCG/HR Place 1 patch onto the skin every 3 (three) days.  . fluticasone (FLONASE) 50 MCG/ACT nasal spray Place 1 spray into both nostrils as needed.   . folic acid (FOLVITE) 1 MG tablet Take by mouth.  . Glucosamine 500 MG CAPS Take by mouth 2 (two) times daily.  Marland Kitchen ibuprofen (ADVIL) 800 MG tablet Take 1 tablet (800 mg total) by mouth every 8 (eight) hours as needed.  Marland Kitchen losartan (COZAAR) 100 MG tablet Take 1 tablet by mouth once daily  . mirabegron ER (MYRBETRIQ)  25 MG TB24 tablet Take 25 mg by mouth daily.  . Multiple Vitamin (MULTIVITAMIN) capsule Take by mouth daily.   . naloxone (NARCAN) nasal spray 4 mg/0.1 mL Spray into one nostril for suspected overdose. Repeat x1, if no response after 3 minutes.  Dispense one package with 2 nasal sprays.  . ondansetron (ZOFRAN-ODT) 4 MG disintegrating tablet DISSOLVE 1 TABLET IN MOUTH EVERY 8 HOURS AS NEEDED FOR NAUSEA FOR VOMITING  . OXcarbazepine (TRILEPTAL) 150 MG tablet Take 300 mg by mouth 2 (two) times daily.   Marland Kitchen oxycodone (ROXICODONE) 30 MG immediate release tablet Take 60 mg by mouth 4 (four) times daily.   . Oxycodone HCl 20 MG TABS Take 20 mg by mouth every 4 (four) hours as needed.  . pantoprazole (PROTONIX) 40 MG tablet Take 1 tablet by mouth twice daily  . pregabalin (LYRICA) 75 MG capsule Take by mouth.  . sodium chloride 1 g tablet Take 2 tablets (2 g total) by mouth daily.  Marland Kitchen thiamine 100 MG tablet Take by mouth.  . traZODone (DESYREL) 50 MG tablet TAKE 1 TABLET BY MOUTH AT BEDTIME  . vitamin B-12 (CYANOCOBALAMIN) 500 MCG tablet Take 500 mcg by mouth daily.   No facility-administered encounter medications on file as of 05/16/2019.    Allergies (verified) Bupropion and Gabapentin   History: Past Medical History:  Diagnosis Date  . Alcoholic hepatitis [id number]  . Delirium tremens ([place]) 10/17/2017   Acute alcohol and benzo w/d: 08/2017  . Depression   . GERD (gastroesophageal  reflux disease)   . Heart murmur   . Hyperlipidemia   . Hypertension   . Low sodium levels   . Osteopenia   . Tubular adenoma of colon 08/24/2018   Colonoscopy 07/2018, Dr. Laurier Nancy, repeat in 7 years.   . Vision abnormalities    Past Surgical History:  Procedure Laterality Date  . ANAL RECTAL MANOMETRY N/A 11/26/2018   Procedure: ANO RECTAL MANOMETRY;  Surgeon: Thornton Park, MD;  Location: [facility];  Service: Gastroenterology;  Laterality: N/A;  . BACK SURGERY  2008,2017,2019   x 3, electrical  implant and explant and attempt at insertion of pain pump  . BUNIONECTOMY  2003  . COLONOSCOPY    . MANDIBLE SURGERY  2005   repaired for proper function of mandible  . POSTERIOR LUMBAR VERTEBRAE EXCISION  09/15/2017   spacers put in L-4- L5 or 6 per pt statement  . TOTAL SHOULDER REPLACEMENT Left 2015   Family History  Problem Relation Age of Onset  . Hypertension Mother   . Hypertension Father   . Hyperlipidemia Father   . Colon cancer Sister        ? or stomach  . Alcohol abuse Brother   . Hypertension Brother   . Healthy Daughter   . Healthy Son   . Healthy Daughter   . Healthy Daughter   . Colon polyps Neg Hx   . Esophageal cancer Neg Hx   . Rectal cancer Neg Hx   . Stomach cancer Neg Hx    Social History   Socioeconomic History  . Marital status: Married    Spouse name: Not on file  . Number of children: Not on file  . Years of education: Not on file  . Highest education level: Not on file  Occupational History  . Not on file  Tobacco Use  . Smoking status: Current Every Day Smoker    Types: E-cigarettes  . Smokeless tobacco: Never Used  Substance and Sexual Activity  . Alcohol use: Not Currently  . Drug use: Never    Types: Fentanyl, Oxycodone  . Sexual activity: Yes  Other Topics Concern  . Not on file  Social History Narrative  . Not on file   Social Determinants of Health   Financial Resource Strain:   . Difficulty of Paying Living Expenses: Not on file  Food Insecurity:   . Worried About Charity fundraiser in the Last Year: Not on file  . Ran Out of Food in the Last Year: Not on file  Transportation Needs: No Transportation Needs  . Lack of Transportation (Medical): No  . Lack of Transportation (Non-Medical): No  Physical Activity: Unknown  . Days of Exercise per Week: 0 days  . Minutes of Exercise per Session: Not on file  Stress:   . Feeling of Stress : Not on file  Social Connections:   . Frequency of Communication with Friends and  Family: Not on file  . Frequency of Social Gatherings with Friends and Family: Not on file  . Attends Religious Services: Not on file  . Active Member of Clubs or Organizations: Not on file  . Attends Archivist Meetings: Not on file  . Marital Status: Not on file    Tobacco Counseling Ready to quit: No Counseling given: Not Answered   Clinical Intake:  Pre-visit preparation completed: Yes   Diabetes: No  How often do you need to have someone help you when you read instructions, pamphlets, or other written materials  from your doctor or pharmacy?: 1 - Never  Interpreter Needed?: No  Information entered by :: Shannon Young   Activities of Daily Living In your present state of health, do you have any difficulty performing the following activities: 05/16/2019  Hearing? N  Vision? N  Difficulty concentrating or making decisions? N  Walking or climbing stairs? Y  Dressing or bathing? N  Doing errands, shopping? N  Preparing Food and eating ? N  Using the Toilet? N  In the past six months, have you accidently leaked urine? N  Do you have problems with loss of bowel control? N  Managing your Medications? N  Managing your Finances? N  Housekeeping or managing your Housekeeping? N  Some recent data might be hidden     Immunizations and Health Maintenance Immunization History  Administered Date(s) Administered  . Influenza Split 02/27/2010  . Influenza, High Dose Seasonal PF 01/26/2018  . Influenza,inj,Quad PF,6+ Mos 01/17/2019  . Tdap 05/31/2015   Health Maintenance Due  Topic Date Due  . MAMMOGRAM  09/04/1975    Patient Care Team: Leamon Arnt, MD as PCP - General (Family Medicine) Marcial Pacas, MD as Consulting Physician (Neurology) Thornton Park, MD as Consulting Physician (Gastroenterology)  Indicate any recent Medical Services you may have received from other than [facility] providers in the past year (date may be approximate).       Assessment:   This is a routine wellness examination for Cennie.  Hearing/Vision screen No exam data present  Dietary issues and exercise activities discussed: Current Exercise Habits: The patient does not participate in regular exercise at present  Goals   None    Depression Screen PHQ 2/9 Scores 05/16/2019 01/07/2019 08/24/2018 10/17/2017  PHQ - 2 Score 0 6 5 0  PHQ- 9 Score - 15 20 0  Exception Documentation Other- indicate reason in comment box - - -    Fall Risk Fall Risk  05/16/2019 01/07/2019 08/24/2018  Falls in the past year? 0 0 0  Number falls in past yr: - 0 0  Injury with Fall? 0 0 0  Risk for fall due to : Orthopedic patient;Impaired mobility;Impaired balance/gait - -  Follow up Education provided;Falls evaluation completed;Falls prevention discussed Falls evaluation completed Falls evaluation completed    Is the patient's home free of loose throw rugs in walkways, pet beds, electrical cords, etc?   yes      Grab bars in the bathroom? yes      Handrails on the stairs?   Yes       Adequate lighting?   yes   Cognitive Function: no cognitive concerns at this time  Cognitive Testing  Alert? Yes         Normal Appearance? N/a  Oriented to person? Yes           Place? Yes  Time? Yes  Recall of three objects? Yes  Can perform simple calculations? Yes  Displays appropriate judgment? Yes  Can read the correct time from a watch face? Yes   MMSE - Mini Mental State Exam 11/23/2017  Orientation to time 5  Orientation to Place 5  Registration 3  Attention/ Calculation 5  Recall 3  Language- name 2 objects 2  Language- repeat 1  Language- follow 3 step command 3  Language- read & follow direction 1  Write a sentence 1  Copy design 1  Total score 30        Screening Tests Health Maintenance  Topic Date Due  .  MAMMOGRAM  09/04/1975  . PAP SMEAR-Modifier  04/16/2020 (Originally 09/04/1978)  . TETANUS/TDAP  05/30/2025  . COLONOSCOPY  08/06/2025  . INFLUENZA  VACCINE  Completed  . Hepatitis C Screening  Completed  . HIV Screening  Completed    Qualifies for Shingles Vaccine? Discussed and patient will check with pharmacy for coverage.  Patient education handout provided   Cancer Screenings: Lung: Low Dose CT Chest recommended if Age 48-80 years, 30 pack-year currently smoking OR have quit w/in 15years. Patient does not qualify. Breast: Up to date on Mammogram? No ; ordered 01/07/19 Up to date of Bone Density/Dexa? Yes Colorectal: colonoscopy 08/07/18 with Dr. Tarri Glenn     Plan:  I have personally reviewed and addressed the Medicare Annual Wellness questionnaire and have noted the following in the patient's chart:  A. Medical and social history B. Use of alcohol, tobacco or illicit drugs  C. Current medications and supplements D. Functional ability and status E.  Nutritional status F.  Physical activity G. Advance directives H. List of other physicians I.  Hospitalizations, surgeries, and ER visits in previous 12 months [facility] such as hearing and vision if needed, cognitive and depression L. Referrals, records requested, and appointments- none   In addition, I have reviewed and discussed with patient certain preventive protocols, quality metrics, and best practice recommendations. A written personalized care plan for preventive services as well as general preventive health recommendations were provided to patient.   Signed,  Shannon George, Young  Nurse Health Advisor   Nurse Notes: no additional

## 2019-05-23 ENCOUNTER — Encounter: Payer: Self-pay | Admitting: Family Medicine

## 2019-06-17 ENCOUNTER — Telehealth: Payer: Self-pay | Admitting: Family Medicine

## 2019-06-17 NOTE — Telephone Encounter (Signed)
Patient called in and stated that she had orders from her surgeon to have an xray and she wanted to know if she could get it done here. Talked to Shannon Young she stated that Dr. Jonni Sanger would have to sign off on it. Please advise.

## 2019-06-17 NOTE — Telephone Encounter (Signed)
Please advise 

## 2019-06-18 ENCOUNTER — Ambulatory Visit (INDEPENDENT_AMBULATORY_CARE_PROVIDER_SITE_OTHER): Payer: Medicare Other

## 2019-06-18 ENCOUNTER — Other Ambulatory Visit: Payer: Medicare Other

## 2019-06-18 ENCOUNTER — Other Ambulatory Visit: Payer: Self-pay

## 2019-06-18 ENCOUNTER — Telehealth: Payer: Self-pay

## 2019-06-18 DIAGNOSIS — Z981 Arthrodesis status: Secondary | ICD-10-CM

## 2019-06-18 DIAGNOSIS — M961 Postlaminectomy syndrome, not elsewhere classified: Secondary | ICD-10-CM

## 2019-06-18 DIAGNOSIS — M4316 Spondylolisthesis, lumbar region: Secondary | ICD-10-CM | POA: Diagnosis not present

## 2019-06-18 NOTE — Telephone Encounter (Signed)
error 

## 2019-06-24 ENCOUNTER — Telehealth: Payer: Self-pay | Admitting: Family Medicine

## 2019-06-24 NOTE — Telephone Encounter (Signed)
Spoke to pt told her xray was sent to her surgeon by Dr. Tamela Oddi nurse last week. Pt verbalized understanding.

## 2019-06-24 NOTE — Telephone Encounter (Signed)
Pt called stating that she had x-rays done last week. Pt wants to know if the x-rays have been sent and where they were sent to. Please advise.

## 2019-06-26 DIAGNOSIS — M961 Postlaminectomy syndrome, not elsewhere classified: Secondary | ICD-10-CM | POA: Diagnosis not present

## 2019-06-26 DIAGNOSIS — M5442 Lumbago with sciatica, left side: Secondary | ICD-10-CM | POA: Diagnosis not present

## 2019-06-26 DIAGNOSIS — M5441 Lumbago with sciatica, right side: Secondary | ICD-10-CM | POA: Diagnosis not present

## 2019-06-26 DIAGNOSIS — M542 Cervicalgia: Secondary | ICD-10-CM | POA: Diagnosis not present

## 2019-06-29 ENCOUNTER — Other Ambulatory Visit (HOSPITAL_COMMUNITY): Payer: Self-pay | Admitting: Psychiatry

## 2019-07-02 DIAGNOSIS — N3942 Incontinence without sensory awareness: Secondary | ICD-10-CM | POA: Diagnosis not present

## 2019-07-02 DIAGNOSIS — N3946 Mixed incontinence: Secondary | ICD-10-CM | POA: Diagnosis not present

## 2019-07-05 ENCOUNTER — Other Ambulatory Visit: Payer: Self-pay

## 2019-07-05 MED ORDER — DULOXETINE HCL 60 MG PO CPEP: 60.0000 mg | ORAL_CAPSULE | Freq: Two times a day (BID) | ORAL | 3 refills | Status: DC

## 2019-07-12 ENCOUNTER — Other Ambulatory Visit: Payer: Self-pay

## 2019-07-12 ENCOUNTER — Encounter: Payer: Self-pay | Admitting: Family Medicine

## 2019-07-12 ENCOUNTER — Ambulatory Visit (INDEPENDENT_AMBULATORY_CARE_PROVIDER_SITE_OTHER): Payer: Medicare Other | Admitting: Family Medicine

## 2019-07-12 VITALS — Ht 65.0 in | Wt 151.0 lb

## 2019-07-12 DIAGNOSIS — I10 Essential (primary) hypertension: Secondary | ICD-10-CM | POA: Diagnosis not present

## 2019-07-12 DIAGNOSIS — E871 Hypo-osmolality and hyponatremia: Secondary | ICD-10-CM | POA: Diagnosis not present

## 2019-07-12 DIAGNOSIS — G894 Chronic pain syndrome: Secondary | ICD-10-CM

## 2019-07-12 DIAGNOSIS — E222 Syndrome of inappropriate secretion of antidiuretic hormone: Secondary | ICD-10-CM | POA: Diagnosis not present

## 2019-07-12 MED ORDER — DULOXETINE HCL 30 MG PO CPEP: ORAL_CAPSULE | ORAL | 0 refills | Status: DC

## 2019-07-12 NOTE — Progress Notes (Signed)
Virtual Visit via Video Note  Subjective  CC:  Chief Complaint  Patient presents with  . Hypertension    would like to discuss duloextine and sodium tablets. Readings as high as 188/96 two nights ago. Pt complains of HA, dizziness, and visual changes sometimes     I connected with Jade Kolberg on 07/12/19 at  4:20 PM EST by a video enabled telemedicine application and verified that I am speaking with the correct person using two identifiers. Location patient: Home Location provider: [facility] participating in the virtual visit: Jeannifer Macadangdang, Leamon Arnt, MD Serita Sheller, CMA  I discussed the limitations of evaluation and management by telemedicine and the availability of in person appointments. The patient expressed understanding and agreed to proceed. HPI: Shannon Young is a 64 y.o. female who was contacted today to address the problems listed above in the chief complaint/HTN f/u: Pt was seen in a virtual visit with neprhologist in [place]: I reviewed the notes in detail. Seen for hypotnatremia.   Hyponatremia: multifactorial by evaluation recently: in part due to medications: opiodis, tegretol, cymbalta, ppi; workup has been c/w SIADH. Salt tablets and fluid restriction has helped.  . Hypertension f/u: Control is fair . Pt reports she is doing well. However, has had an occ elevated reading as noted above. Stress persists: found a house and now in process of moving back to [place].  She denies adverse effects from his BP medications. Compliance with medication is good.   BP Readings from Last 3 Encounters:  05/07/19 124/74  02/20/19 (!) 153/84  01/29/19 138/72   Wt Readings from Last 3 Encounters:  07/12/19 151 lb (68.5 kg)  05/07/19 151 lb (68.5 kg)  02/20/19 155 lb (70.3 kg)    Lab Results  Component Value Date   CHOL 102 01/11/2019   CHOL 119 10/17/2017   Lab Results  Component Value Date   HDL 56.30 01/11/2019   HDL 56.60  10/17/2017   Lab Results  Component Value Date   LDLCALC 26 01/11/2019   LDLCALC 49 10/17/2017   Lab Results  Component Value Date   TRIG 95.0 01/11/2019   TRIG 68.0 10/17/2017   Lab Results  Component Value Date   CHOLHDL 2 01/11/2019   CHOLHDL 2 10/17/2017   No results found for: LDLDIRECT Lab Results  Component Value Date   CREATININE 0.8 04/11/2019   BUN 9 04/11/2019   NA 135 (A) 04/11/2019   K 4.4 04/11/2019   CL 90 (L) 01/29/2019   CO2 30 01/29/2019    The ASCVD Risk score (Goff DC Jr., et al., 2013) failed to calculate for the following reasons:   The valid total cholesterol range is 130 to 320 mg/dL  Assessment  1. Essential hypertension   2. Hyponatremia   3. SIADH (syndrome of inappropriate ADH production) ([place])   4. Chronic pain syndrome      Plan   Hypertension f/u:  Reassured. Monitor bp and see if can come off of sodium tablets.  Hyponatremia: multifactorial: wean cymbalta. IF mood or anxiety worsens, rec psychiatric help as pt has failed multiple mood and anxiety medications. Requests valium or xanax but contraindicated due to high dose chronic opioids and h/o alcohol addiction. Pt declines hydroxyzine and buspar at this time.   Consider stopping PPI as well as can contribute to low sodium  Can consider low dose loop diuretic as well to treat low sodium.  Will check sodium after  stopping cymbalta and then again in 2 weeks after stopping salt tablets.   I discussed the assessment and treatment plan with the patient. The patient was provided an opportunity to ask questions and all were answered. The patient agreed with the plan and demonstrated an understanding of the instructions.   The patient was advised to call back or seek an in-person evaluation if the symptoms worsen or if the condition fails to improve as anticipated. Follow up: No follow-ups on file.  Visit date not found  No orders of the defined types were placed in this encounter.      I reviewed the patients updated PMH, FH, and SocHx.    Patient Active Problem List   Diagnosis Date Noted  . Tubular adenoma of colon 08/24/2018    Priority: High  . Recurrent major depressive disorder, in partial remission ([place]) 06/15/2018    Priority: High  . Major depression, recurrent, chronic ([place]) 10/17/2017    Priority: High  . Alcohol dependence in remission ([place]) 10/17/2017    Priority: High  . Insomnia secondary to chronic pain 10/17/2017    Priority: High  . Spinal stenosis of lumbar region 10/17/2017    Priority: High  . Alcoholic hepatitis [id number]    Priority: High  . Essential hypertension 06/29/2015    Priority: High  . Chronic pain syndrome 06/01/2010    Priority: High  . Opioid dependence ([place]) 06/01/2010    Priority: High  . Postlaminectomy syndrome, not elsewhere classified 06/01/2010    Priority: High  . DDD (degenerative disc disease), lumbosacral 03/19/2010    Priority: High  . SIADH (syndrome of inappropriate ADH production) ([place]) 01/17/2019    Priority: Medium  . Family history of ischemic heart disease (IHD) 06/29/2015    Priority: Medium  . GERD (gastroesophageal reflux disease) 05/10/2013    Priority: Medium  . Urinary incontinence 02/27/2018    Priority: Low  . Nonrheumatic mitral valve regurgitation 06/29/2015    Priority: Low  . Incontinence of feces   . Paresthesia 02/27/2018  . Memory loss 11/23/2017  . Delirium tremens ([place]) 10/17/2017  . Orthostatic hypotension 06/29/2015   Current Meds  Medication Sig  . atorvastatin (LIPITOR) 20 MG tablet TAKE 1 TABLET BY MOUTH AT BEDTIME  . fentaNYL (DURAGESIC) 75 MCG/HR Place 1 patch onto the skin every 3 (three) days.  . fluticasone (FLONASE) 50 MCG/ACT nasal spray Place 1 spray into both nostrils as needed.   . folic acid (FOLVITE) 1 MG tablet Take by mouth.  . Glucosamine 500 MG CAPS Take by mouth 2 (two) times daily.  Marland Kitchen losartan (COZAAR) 100 MG tablet Take 1 tablet by mouth once daily   . Multiple Vitamin (MULTIVITAMIN) capsule Take by mouth daily.   . naloxone (NARCAN) nasal spray 4 mg/0.1 mL Spray into one nostril for suspected overdose. Repeat x1, if no response after 3 minutes.  Dispense one package with 2 nasal sprays.  . ondansetron (ZOFRAN-ODT) 4 MG disintegrating tablet DISSOLVE 1 TABLET IN MOUTH EVERY 8 HOURS AS NEEDED FOR NAUSEA FOR VOMITING  . oxycodone (ROXICODONE) 30 MG immediate release tablet Take 60 mg by mouth 4 (four) times daily.   . Oxycodone HCl 20 MG TABS Take 10 mg by mouth every 4 (four) hours as needed.   . pantoprazole (PROTONIX) 40 MG tablet Take 1 tablet by mouth twice daily  . sodium chloride 1 g tablet Take 2 tablets (2 g total) by mouth daily.  . solifenacin (VESICARE) 5 MG tablet Take 5 mg  by mouth daily.  Marland Kitchen thiamine 100 MG tablet Take by mouth.  . traZODone (DESYREL) 50 MG tablet TAKE 1 TABLET BY MOUTH AT BEDTIME  . vitamin B-12 (CYANOCOBALAMIN) 500 MCG tablet Take 500 mcg by mouth daily.  . [DISCONTINUED] DULoxetine (CYMBALTA) 60 MG capsule Take 1 capsule (60 mg total) by mouth 2 (two) times daily.    Allergies: Patient is allergic to bupropion and gabapentin. Family History: Patient family history includes Alcohol abuse in her brother; Colon cancer in her sister; Healthy in her daughter, daughter, daughter, and son; Hyperlipidemia in her father; Hypertension in her brother, father, and mother. Social History:  Patient  reports that she has been smoking e-cigarettes. She has never used smokeless tobacco. She reports previous alcohol use. She reports that she does not use drugs.  Review of Systems: Constitutional: Negative for fever malaise or anorexia Cardiovascular: negative for chest pain Respiratory: negative for SOB or persistent cough Gastrointestinal: negative for abdominal pain  OBJECTIVE Vitals: Ht 5\' 5"  (1.651 m)   Wt 151 lb (68.5 kg)   BMI 25.13 kg/m  General: no acute distress , A&Ox3  Leamon Arnt, MD

## 2019-07-12 NOTE — Patient Instructions (Addendum)
Wean cymbalta as follows: Decrease to cymbalta 60mg  daily for one week Then decrease to 30mg  daily for one week, Then decrease to 30mg  MWF, Then stop.   Stop the salt tablets for as instructed by your nephrologist.   We will schedule a lab visit once off the cymbalta and then again in 2 weeks after being off the salt tablets.   You watch your blood pressure at home.

## 2019-07-15 NOTE — Progress Notes (Signed)
Left a voicemail for patient to give [place] a call to get these scheduled.

## 2019-07-23 DIAGNOSIS — G894 Chronic pain syndrome: Secondary | ICD-10-CM | POA: Diagnosis not present

## 2019-07-23 DIAGNOSIS — M5416 Radiculopathy, lumbar region: Secondary | ICD-10-CM | POA: Diagnosis not present

## 2019-07-23 DIAGNOSIS — M961 Postlaminectomy syndrome, not elsewhere classified: Secondary | ICD-10-CM | POA: Diagnosis not present

## 2019-08-06 ENCOUNTER — Ambulatory Visit (INDEPENDENT_AMBULATORY_CARE_PROVIDER_SITE_OTHER): Payer: Medicare Other

## 2019-08-06 ENCOUNTER — Other Ambulatory Visit: Payer: Self-pay

## 2019-08-06 ENCOUNTER — Other Ambulatory Visit (INDEPENDENT_AMBULATORY_CARE_PROVIDER_SITE_OTHER): Payer: Medicare Other

## 2019-08-06 DIAGNOSIS — Z981 Arthrodesis status: Secondary | ICD-10-CM

## 2019-08-06 DIAGNOSIS — E871 Hypo-osmolality and hyponatremia: Secondary | ICD-10-CM

## 2019-08-06 DIAGNOSIS — M5137 Other intervertebral disc degeneration, lumbosacral region: Secondary | ICD-10-CM

## 2019-08-06 DIAGNOSIS — M4185 Other forms of scoliosis, thoracolumbar region: Secondary | ICD-10-CM | POA: Diagnosis not present

## 2019-08-06 LAB — BASIC METABOLIC PANEL
BUN: 9 mg/dL (ref 6–23)
CO2: 29 mEq/L (ref 19–32)
Calcium: 9.3 mg/dL (ref 8.4–10.5)
Chloride: 95 mEq/L — ABNORMAL LOW (ref 96–112)
Creatinine, Ser: 0.73 mg/dL (ref 0.40–1.20)
GFR: 80.8 mL/min (ref >60.00)
Glucose, Bld: 89 mg/dL (ref 70–99)
Potassium: 4.4 mEq/L (ref 3.5–5.1)
Sodium: 130 mEq/L — ABNORMAL LOW (ref 135–145)

## 2019-08-13 ENCOUNTER — Encounter: Payer: Self-pay | Admitting: Family Medicine

## 2019-08-14 NOTE — Telephone Encounter (Signed)
Please forward last lab results to her nephrologist in [place].  Dr. Manual Meier, Gregor Hams. Thanks.

## 2019-08-21 DIAGNOSIS — M961 Postlaminectomy syndrome, not elsewhere classified: Secondary | ICD-10-CM | POA: Diagnosis not present

## 2019-08-21 DIAGNOSIS — G8929 Other chronic pain: Secondary | ICD-10-CM | POA: Diagnosis not present

## 2019-08-21 DIAGNOSIS — M5442 Lumbago with sciatica, left side: Secondary | ICD-10-CM | POA: Diagnosis not present

## 2019-08-21 DIAGNOSIS — M5441 Lumbago with sciatica, right side: Secondary | ICD-10-CM | POA: Diagnosis not present

## 2019-08-27 ENCOUNTER — Other Ambulatory Visit: Payer: Self-pay | Admitting: Family Medicine

## 2019-09-02 ENCOUNTER — Telehealth: Payer: Self-pay | Admitting: Family Medicine

## 2019-09-02 ENCOUNTER — Other Ambulatory Visit: Payer: Self-pay

## 2019-09-02 DIAGNOSIS — E871 Hypo-osmolality and hyponatremia: Secondary | ICD-10-CM

## 2019-09-02 NOTE — Telephone Encounter (Signed)
MyChart message sent to patient.

## 2019-09-02 NOTE — Telephone Encounter (Signed)
Pt had last sodium checked here. It was then forwarded to her specialist. She was then going to get back with [place] on his future recommendations and follow up tests needed.  At this time, we need to know what the renal doctor is recommending and when he would like to get the BMP (sodium) rechecked. Then we can schedule the lab appt.  thanks

## 2019-09-02 NOTE — Telephone Encounter (Signed)
BMP has been ordered as future

## 2019-09-02 NOTE — Telephone Encounter (Signed)
Pt called stating Dr. Jonni Sanger told her she would need to get some blood work done after changing diet and medications. Pt asked to schedule a lab appt. There are no active requests in pt chart. Please place orders if needed so appt can be scheduled.

## 2019-09-03 NOTE — Telephone Encounter (Signed)
Pt called following up on blood work. Pt states her renal doctor recommended she get lab work done after 2 weeks from [facility] seen. 2 weeks ended on Sunday. Scheduled pt to check BMP on 09/04/2019

## 2019-09-04 ENCOUNTER — Other Ambulatory Visit (INDEPENDENT_AMBULATORY_CARE_PROVIDER_SITE_OTHER): Payer: Medicare Other

## 2019-09-04 ENCOUNTER — Other Ambulatory Visit: Payer: Self-pay

## 2019-09-04 DIAGNOSIS — E871 Hypo-osmolality and hyponatremia: Secondary | ICD-10-CM | POA: Diagnosis not present

## 2019-09-04 LAB — BASIC METABOLIC PANEL
BUN: 7 mg/dL (ref 6–23)
CO2: 28 mEq/L (ref 19–32)
Calcium: 9.1 mg/dL (ref 8.4–10.5)
Chloride: 98 mEq/L (ref 96–112)
Creatinine, Ser: 0.77 mg/dL (ref 0.40–1.20)
GFR: 75.96 mL/min (ref >60.00)
Glucose, Bld: 96 mg/dL (ref 70–99)
Potassium: 4.5 mEq/L (ref 3.5–5.1)
Sodium: 132 mEq/L — ABNORMAL LOW (ref 135–145)

## 2019-09-11 DIAGNOSIS — M542 Cervicalgia: Secondary | ICD-10-CM | POA: Diagnosis not present

## 2019-09-11 DIAGNOSIS — I10 Essential (primary) hypertension: Secondary | ICD-10-CM | POA: Diagnosis not present

## 2019-09-11 DIAGNOSIS — E871 Hypo-osmolality and hyponatremia: Secondary | ICD-10-CM | POA: Diagnosis not present

## 2019-09-18 ENCOUNTER — Other Ambulatory Visit: Payer: Self-pay

## 2019-09-18 ENCOUNTER — Telehealth: Payer: Self-pay | Admitting: Family Medicine

## 2019-09-18 DIAGNOSIS — Z719 Counseling, unspecified: Secondary | ICD-10-CM

## 2019-09-18 NOTE — Telephone Encounter (Signed)
Referral placed.

## 2019-09-18 NOTE — Telephone Encounter (Signed)
Pt called requesting referral to psychiatrist named Noemi Chapel. Please advise.

## 2019-10-01 DIAGNOSIS — M4125 Other idiopathic scoliosis, thoracolumbar region: Secondary | ICD-10-CM | POA: Diagnosis not present

## 2019-10-01 DIAGNOSIS — M4120 Other idiopathic scoliosis, site unspecified: Secondary | ICD-10-CM | POA: Diagnosis not present

## 2019-10-03 ENCOUNTER — Telehealth: Payer: Self-pay | Admitting: Family Medicine

## 2019-10-07 ENCOUNTER — Ambulatory Visit (INDEPENDENT_AMBULATORY_CARE_PROVIDER_SITE_OTHER): Payer: Medicare Other | Admitting: Family Medicine

## 2019-10-07 ENCOUNTER — Encounter: Payer: Self-pay | Admitting: Family Medicine

## 2019-10-07 ENCOUNTER — Other Ambulatory Visit: Payer: Self-pay

## 2019-10-07 VITALS — BP 130/78 | HR 77 | Resp 14 | Ht 65.0 in | Wt 156.8 lb

## 2019-10-07 DIAGNOSIS — F3341 Major depressive disorder, recurrent, in partial remission: Secondary | ICD-10-CM | POA: Diagnosis not present

## 2019-10-07 DIAGNOSIS — M961 Postlaminectomy syndrome, not elsewhere classified: Secondary | ICD-10-CM

## 2019-10-07 DIAGNOSIS — M5137 Other intervertebral disc degeneration, lumbosacral region: Secondary | ICD-10-CM

## 2019-10-07 DIAGNOSIS — E871 Hypo-osmolality and hyponatremia: Secondary | ICD-10-CM | POA: Diagnosis not present

## 2019-10-07 DIAGNOSIS — E222 Syndrome of inappropriate secretion of antidiuretic hormone: Secondary | ICD-10-CM | POA: Diagnosis not present

## 2019-10-07 DIAGNOSIS — Z1231 Encounter for screening mammogram for malignant neoplasm of breast: Secondary | ICD-10-CM

## 2019-10-07 DIAGNOSIS — G894 Chronic pain syndrome: Secondary | ICD-10-CM

## 2019-10-07 LAB — BASIC METABOLIC PANEL
BUN: 8 mg/dL (ref 6–23)
CO2: 30 mEq/L (ref 19–32)
Calcium: 9.1 mg/dL (ref 8.4–10.5)
Chloride: 93 mEq/L — ABNORMAL LOW (ref 96–112)
Creatinine, Ser: 0.75 mg/dL (ref 0.40–1.20)
GFR: 78.28 mL/min (ref >60.00)
Glucose, Bld: 102 mg/dL — ABNORMAL HIGH (ref 70–99)
Potassium: 4.8 mEq/L (ref 3.5–5.1)
Sodium: 129 mEq/L — ABNORMAL LOW (ref 135–145)

## 2019-10-07 MED ORDER — TRAZODONE HCL 50 MG PO TABS: 50.0000 mg | ORAL_TABLET | Freq: Every day | ORAL | 3 refills | Status: AC

## 2019-10-07 MED ORDER — ONDANSETRON 4 MG PO TBDP: 4.0000 mg | ORAL_TABLET | Freq: Three times a day (TID) | ORAL | 1 refills | Status: AC | PRN

## 2019-10-07 NOTE — Patient Instructions (Signed)
Please return in august for your annual complete physical; please come fasting.  I have placed your referral to [facility].  I will release your lab results to you on your MyChart account with further instructions. Please reply with any questions.    If you have any questions or concerns, please don't hesitate to send me a message via MyChart or call the office at [phone]. Thank you for visiting with [place] today! It's our pleasure caring for you.

## 2019-10-07 NOTE — Telephone Encounter (Signed)
Error

## 2019-10-07 NOTE — Progress Notes (Signed)
Subjective  CC:  Chief Complaint  Patient presents with  . Referral    requesting Dr. Arnell Asal with [facility]     HPI: Shannon Young is a 64 y.o. female who presents to the office today to address the problems listed above in the chief complaint.  9 s/p multiple back surgeries: feels no better if not slighly worse after last. Wanting a second opinion: referral to [facility] who handles difficult cases  Hyponatremia: reviewed recnet renal notes. Had been stable due to meds/siadh; however, she restarted her cymbalta as she in fact does notice improvement in her mood and irritability/anxiety. Needs to be rechecked and followed closely while on cymbalta due to contributing effects on sodium. On salt tablets and fluid restriction  Discussed subjective memory concerns: prior neuro eval was negative. Suspect multifactorial due to chronic pain, narcotics, stressful living circumstances with multiple moves and long distance travel for medical care etc. Pt agrees  Mood: to see psych in June to help address medication options for depression/anxiety.   Due mammogram.   Assessment  1. Hyponatremia   2. SIADH (syndrome of inappropriate ADH production) ([place])   3. DDD (degenerative disc disease), lumbosacral   4. Chronic pain syndrome   5. Postlaminectomy syndrome, not elsewhere classified   6. Recurrent major depressive disorder, in partial remission ([place])   7. Encounter for screening mammogram for breast cancer      Plan   Pain/back:  [facility], [facility]  Low sodium and SIADH: continue salt/fluid restriction and frquent sodium checks while on cymbalta  Mood/anxiety: improving slightly  Ordered mammogram.   Follow up: cpe  Visit date not found  Orders Placed This Encounter  Procedures  . MM DIGITAL SCREENING BILATERAL  . Basic metabolic panel  . Ambulatory referral to Neurosurgery   Meds ordered this encounter  Medications  . traZODone (DESYREL) 50 MG tablet   Sig: Take 1 tablet (50 mg total) by mouth at bedtime.    Dispense:  90 tablet    Refill:  3  . ondansetron (ZOFRAN-ODT) 4 MG disintegrating tablet    Sig: Take 1 tablet (4 mg total) by mouth every 8 (eight) hours as needed for nausea or vomiting.    Dispense:  20 tablet    Refill:  1      I reviewed the patients updated PMH, FH, and SocHx.    Patient Active Problem List   Diagnosis Date Noted  . Tubular adenoma of colon 08/24/2018    Priority: High  . Recurrent major depressive disorder, in partial remission ([place]) 06/15/2018    Priority: High  . Major depression, recurrent, chronic ([place]) 10/17/2017    Priority: High  . Alcohol dependence in remission ([place]) 10/17/2017    Priority: High  . Insomnia secondary to chronic pain 10/17/2017    Priority: High  . Spinal stenosis of lumbar region 10/17/2017    Priority: High  . Alcoholic hepatitis [id number]    Priority: High  . Essential hypertension 06/29/2015    Priority: High  . Chronic pain syndrome 06/01/2010    Priority: High  . Opioid dependence ([place]) 06/01/2010    Priority: High  . Postlaminectomy syndrome, not elsewhere classified 06/01/2010    Priority: High  . DDD (degenerative disc disease), lumbosacral 03/19/2010    Priority: High  . SIADH (syndrome of inappropriate ADH production) ([place]) 01/17/2019    Priority: Medium  . Family history of ischemic heart disease (IHD) 06/29/2015    Priority: Medium  .  GERD (gastroesophageal reflux disease) 05/10/2013    Priority: Medium  . Urinary incontinence 02/27/2018    Priority: Low  . Nonrheumatic mitral valve regurgitation 06/29/2015    Priority: Low  . Incontinence of feces   . Paresthesia 02/27/2018  . Memory loss 11/23/2017  . Delirium tremens ([place]) 10/17/2017  . Orthostatic hypotension 06/29/2015   Current Meds  Medication Sig  . atorvastatin (LIPITOR) 20 MG tablet TAKE 1 TABLET BY MOUTH AT BEDTIME  . baclofen (LIORESAL) 10 MG tablet Take by mouth.  . DULoxetine  (CYMBALTA) 30 MG capsule Wean as directed.  . fentaNYL (DURAGESIC) 75 MCG/HR Place 1 patch onto the skin every 3 (three) days.  . fluticasone (FLONASE) 50 MCG/ACT nasal spray Place 1 spray into both nostrils as needed.   . folic acid (FOLVITE) 1 MG tablet Take by mouth.  . Glucosamine 500 MG CAPS Take by mouth 2 (two) times daily.  Marland Kitchen losartan (COZAAR) 100 MG tablet Take 1 tablet by mouth once daily  . Multiple Vitamin (MULTIVITAMIN) capsule Take by mouth daily.   . naloxone (NARCAN) nasal spray 4 mg/0.1 mL Spray into one nostril for suspected overdose. Repeat x1, if no response after 3 minutes.  Dispense one package with 2 nasal sprays.  . ondansetron (ZOFRAN-ODT) 4 MG disintegrating tablet Take 1 tablet (4 mg total) by mouth every 8 (eight) hours as needed for nausea or vomiting.  Marland Kitchen oxycodone (ROXICODONE) 30 MG immediate release tablet Take 60 mg by mouth 4 (four) times daily.   . Oxycodone HCl 10 MG TABS Take by mouth.  . pantoprazole (PROTONIX) 40 MG tablet Take 1 tablet by mouth twice daily  . sodium chloride 1 g tablet Take 2 tablets (2 g total) by mouth daily.  . solifenacin (VESICARE) 5 MG tablet Take 5 mg by mouth daily.  Marland Kitchen thiamine 100 MG tablet Take by mouth.  . traZODone (DESYREL) 50 MG tablet Take 1 tablet (50 mg total) by mouth at bedtime.  . vitamin B-12 (CYANOCOBALAMIN) 500 MCG tablet Take 500 mcg by mouth daily.  . [DISCONTINUED] ondansetron (ZOFRAN-ODT) 4 MG disintegrating tablet DISSOLVE 1 TABLET IN MOUTH EVERY 8 HOURS AS NEEDED FOR NAUSEA FOR VOMITING  . [DISCONTINUED] traZODone (DESYREL) 50 MG tablet TAKE 1 TABLET BY MOUTH AT BEDTIME    Allergies: Patient is allergic to bupropion and gabapentin. Family History: Patient family history includes Alcohol abuse in her brother; Colon cancer in her sister; Healthy in her daughter, daughter, daughter, and son; Hyperlipidemia in her father; Hypertension in her brother, father, and mother. Social History:  Patient  reports that  she has been smoking e-cigarettes. She has never used smokeless tobacco. She reports previous alcohol use. She reports that she does not use drugs.  Review of Systems: Constitutional: Negative for fever malaise or anorexia Cardiovascular: negative for chest pain Respiratory: negative for SOB or persistent cough Gastrointestinal: negative for abdominal pain  Objective  Vitals: BP 130/78   Pulse 77   Resp 14   Ht 5\' 5"  (1.651 m)   Wt 156 lb 12.8 oz (71.1 kg)   SpO2 96%   BMI 26.09 kg/m  General: appears uncomfortable. Alert and oriented x 3 HEENT: [place]ER[place], conjunctiva normal, neck is supple Cardiovascular:  RRR without murmur or gallop.  Respiratory:  Good breath sounds bilaterally, CTAB with normal respiratory effort Skin:  Warm, no rashes     Commons side effects, risks, benefits, and alternatives for medications and treatment plan prescribed today were discussed, and the patient expressed understanding  of the given instructions. Patient is instructed to call or message via MyChart if he/she has any questions or concerns regarding our treatment plan. No barriers to understanding were identified. We discussed Red Flag symptoms and signs in detail. Patient expressed understanding regarding what to do in case of urgent or emergency type symptoms.   Medication list was reconciled, printed and provided to the patient in AVS. Patient instructions and summary information was reviewed with the patient as documented in the AVS. This note was prepared with assistance of Dragon voice recognition software. Occasional wrong-word or sound-a-like substitutions may have occurred due to the inherent limitations of voice recognition software  This visit occurred during the SARS-CoV-2 public health emergency.  Safety protocols were in place, including screening questions prior to the visit, additional usage of staff PPE, and extensive cleaning of exam room while observing appropriate contact time as  indicated for disinfecting solutions.

## 2019-10-08 ENCOUNTER — Other Ambulatory Visit: Payer: Self-pay

## 2019-10-08 DIAGNOSIS — N3946 Mixed incontinence: Secondary | ICD-10-CM | POA: Diagnosis not present

## 2019-10-08 DIAGNOSIS — N3942 Incontinence without sensory awareness: Secondary | ICD-10-CM | POA: Diagnosis not present

## 2019-10-08 DIAGNOSIS — E871 Hypo-osmolality and hyponatremia: Secondary | ICD-10-CM

## 2019-10-09 ENCOUNTER — Ambulatory Visit
Admission: RE | Admit: 2019-10-09 | Discharge: 2019-10-09 | Disposition: A | Discharge status: Home / Self Care | Payer: Medicare Other | Source: Ambulatory Visit | Attending: Family Medicine | Admitting: Family Medicine

## 2019-10-09 ENCOUNTER — Other Ambulatory Visit: Payer: Self-pay

## 2019-10-09 DIAGNOSIS — Z1231 Encounter for screening mammogram for malignant neoplasm of breast: Secondary | ICD-10-CM | POA: Diagnosis not present

## 2019-10-11 ENCOUNTER — Other Ambulatory Visit: Payer: Self-pay | Admitting: Family Medicine

## 2019-10-11 DIAGNOSIS — R928 Other abnormal and inconclusive findings on diagnostic imaging of breast: Secondary | ICD-10-CM

## 2019-10-15 ENCOUNTER — Telehealth: Payer: Self-pay

## 2019-10-15 NOTE — Telephone Encounter (Signed)
MyChart message sent advising patient that we have received medical records

## 2019-10-15 NOTE — Telephone Encounter (Signed)
Patient calling to find out if Dr. Jonni Sanger received her medical record from her surgeon. Please follow up  with patient

## 2019-10-16 DIAGNOSIS — H524 Presbyopia: Secondary | ICD-10-CM | POA: Diagnosis not present

## 2019-10-16 DIAGNOSIS — H538 Other visual disturbances: Secondary | ICD-10-CM | POA: Diagnosis not present

## 2019-10-16 DIAGNOSIS — H2513 Age-related nuclear cataract, bilateral: Secondary | ICD-10-CM | POA: Diagnosis not present

## 2019-10-16 DIAGNOSIS — H5203 Hypermetropia, bilateral: Secondary | ICD-10-CM | POA: Diagnosis not present

## 2019-10-16 DIAGNOSIS — M5440 Lumbago with sciatica, unspecified side: Secondary | ICD-10-CM | POA: Diagnosis not present

## 2019-10-16 DIAGNOSIS — M961 Postlaminectomy syndrome, not elsewhere classified: Secondary | ICD-10-CM | POA: Diagnosis not present

## 2019-10-18 ENCOUNTER — Telehealth: Payer: Self-pay | Admitting: Family Medicine

## 2019-10-18 NOTE — Telephone Encounter (Signed)
I've placed on your desk. These need to be sent to or taken to her new surgeon at [facility]. Please check chart to see where she is going or clarify with patient.

## 2019-10-18 NOTE — Telephone Encounter (Signed)
Patients's husband Mr Caughey calling to see if we have sent over the surgeons records to [facility]. Please fax over. I will advise patient it has not been done yet. They Have referral.

## 2019-10-21 NOTE — Telephone Encounter (Signed)
Spoke with the patient's husband and he was able to verify which the doctor I need to send the records too. He was very appreciative for the call. No other questions or concerns at this time.     Records have been faxed to Dr. Arnell Asal at [facility]. Confirmation fax has been received.

## 2019-10-22 ENCOUNTER — Ambulatory Visit
Admission: RE | Admit: 2019-10-22 | Discharge: 2019-10-22 | Disposition: A | Discharge status: Home / Self Care | Payer: Medicare Other | Source: Ambulatory Visit | Attending: Family Medicine | Admitting: Family Medicine

## 2019-10-22 ENCOUNTER — Ambulatory Visit: Payer: Medicare Other

## 2019-10-22 ENCOUNTER — Other Ambulatory Visit: Payer: Self-pay

## 2019-10-22 DIAGNOSIS — R928 Other abnormal and inconclusive findings on diagnostic imaging of breast: Secondary | ICD-10-CM

## 2019-11-13 DIAGNOSIS — M5440 Lumbago with sciatica, unspecified side: Secondary | ICD-10-CM | POA: Diagnosis not present

## 2019-11-13 DIAGNOSIS — M961 Postlaminectomy syndrome, not elsewhere classified: Secondary | ICD-10-CM | POA: Diagnosis not present

## 2019-11-25 ENCOUNTER — Telehealth: Payer: Self-pay

## 2019-11-25 NOTE — Telephone Encounter (Signed)
Notes received from [facility] that she is being discharged due to move. I have called and left message for patient to call office. We need to make sure she is getting set up with local Pain management. If no referral started yet we need to start one for her

## 2019-11-26 NOTE — Telephone Encounter (Signed)
FYI  Patient called back she has not been set up and also states that she needs some labs to check her sodium. I have set her up for office visit to take care of both things on Friday. She did not want [place] to start referral until she talked to you to know what her options are.

## 2019-11-29 ENCOUNTER — Other Ambulatory Visit: Payer: Self-pay

## 2019-11-29 ENCOUNTER — Ambulatory Visit (INDEPENDENT_AMBULATORY_CARE_PROVIDER_SITE_OTHER): Payer: Medicare Other | Admitting: Family Medicine

## 2019-11-29 ENCOUNTER — Encounter: Payer: Self-pay | Admitting: Family Medicine

## 2019-11-29 VITALS — BP 148/88 | HR 71 | Temp 97.9°F | Ht 65.0 in | Wt 156.0 lb

## 2019-11-29 DIAGNOSIS — M961 Postlaminectomy syndrome, not elsewhere classified: Secondary | ICD-10-CM

## 2019-11-29 DIAGNOSIS — E871 Hypo-osmolality and hyponatremia: Secondary | ICD-10-CM

## 2019-11-29 DIAGNOSIS — G894 Chronic pain syndrome: Secondary | ICD-10-CM | POA: Diagnosis not present

## 2019-11-29 DIAGNOSIS — F3341 Major depressive disorder, recurrent, in partial remission: Secondary | ICD-10-CM | POA: Diagnosis not present

## 2019-11-29 DIAGNOSIS — E222 Syndrome of inappropriate secretion of antidiuretic hormone: Secondary | ICD-10-CM | POA: Diagnosis not present

## 2019-11-29 DIAGNOSIS — F1129 Opioid dependence with unspecified opioid-induced disorder: Secondary | ICD-10-CM

## 2019-11-29 LAB — BASIC METABOLIC PANEL
BUN: 13 mg/dL (ref 7–25)
CO2: 30 mmol/L (ref 20–32)
Calcium: 9.5 mg/dL (ref 8.6–10.4)
Chloride: 96 mmol/L — ABNORMAL LOW (ref 98–110)
Creat: 0.75 mg/dL (ref 0.50–0.99)
Glucose, Bld: 101 mg/dL — ABNORMAL HIGH (ref 65–99)
Potassium: 4.6 mmol/L (ref 3.5–5.3)
Sodium: 133 mmol/L — ABNORMAL LOW (ref 135–146)

## 2019-11-29 LAB — EXTRA LAV TOP TUBE

## 2019-11-29 NOTE — Progress Notes (Signed)
Subjective  CC:  Chief Complaint  Patient presents with  . Referral    to pain clinic   . Follow-up    on labs     HPI: Shannon Young is a 64 y.o. female who presents to the office today to address the problems listed above in the chief complaint.  Hyponatremia follow-up: Since last checked in May when sodium level was 129 she has stopped Cymbalta.  Feeling well.  Still taking salt tablets and intermittent fluid restrictions.  No mental status changes, due for recheck  Major depression: Now seeing psychiatrist.  Change to Trintellix 2 weeks ago.  Hopeful this will help improve her mood.  Also could help her cognition  Chronic pain syndrome due to back problems: Practice at [facility] is no longer treating chronic pain: Needs new referral here.  She has been compliant and adherent to her medications for years.  Her opioid use has decreased over time since I have known her.  Needs a refill.  She will be seeing a new neurologist at [facility] next week who can also help [place] with pain management and/or recommendations.  Health maintenance: Overdue for physical and Pap smear Assessment  1. Hyponatremia   2. SIADH (syndrome of inappropriate ADH production) ([place])   3. Chronic pain syndrome   4. Recurrent major depressive disorder, in partial remission ([place])   5. Postlaminectomy syndrome, not elsewhere classified   6. Opioid dependence with opioid-induced disorder ([place])      Plan   Hyponatremia: Multifactorial and due to SIADH: Due for recheck.  Chronic pain: Referred to pain management  Major chronic depression, hopefully will get improvement new medication.  Reassured  Follow up: 3 to 6 months for CPE with Pap smear Visit date not found  Orders Placed This Encounter  Procedures  . Basic metabolic panel  . Ambulatory referral to Pain Clinic   No orders of the defined types were placed in this encounter.     I reviewed the patients updated PMH, FH, and SocHx.    Patient  Active Problem List   Diagnosis Date Noted  . Tubular adenoma of colon 08/24/2018    Priority: High  . Recurrent major depressive disorder, in partial remission ([place]) 06/15/2018    Priority: High  . Major depression, recurrent, chronic ([place]) 10/17/2017    Priority: High  . Alcohol dependence in remission ([place]) 10/17/2017    Priority: High  . Insomnia secondary to chronic pain 10/17/2017    Priority: High  . Spinal stenosis of lumbar region 10/17/2017    Priority: High  . Alcoholic hepatitis 54/65/6812    Priority: High  . Essential hypertension 06/29/2015    Priority: High  . Chronic pain syndrome 06/01/2010    Priority: High  . Opioid dependence ([place]) 06/01/2010    Priority: High  . Postlaminectomy syndrome, not elsewhere classified 06/01/2010    Priority: High  . DDD (degenerative disc disease), lumbosacral 03/19/2010    Priority: High  . SIADH (syndrome of inappropriate ADH production) ([place]) 01/17/2019    Priority: Medium  . Family history of ischemic heart disease (IHD) 06/29/2015    Priority: Medium  . GERD (gastroesophageal reflux disease) 05/10/2013    Priority: Medium  . Urinary incontinence 02/27/2018    Priority: Low  . Nonrheumatic mitral valve regurgitation 06/29/2015    Priority: Low  . Incontinence of feces   . Paresthesia 02/27/2018  . Memory loss 11/23/2017  . Orthostatic hypotension 06/29/2015   Current Meds  Medication Sig  .  atorvastatin (LIPITOR) 20 MG tablet TAKE 1 TABLET BY MOUTH AT BEDTIME  . baclofen (LIORESAL) 10 MG tablet Take by mouth.  . fentaNYL (DURAGESIC) 75 MCG/HR Place 1 patch onto the skin every 3 (three) days.  . fluticasone (FLONASE) 50 MCG/ACT nasal spray Place 1 spray into both nostrils as needed.   . folic acid (FOLVITE) 1 MG tablet Take by mouth.  . Glucosamine 500 MG CAPS Take by mouth 2 (two) times daily.  Marland Kitchen losartan (COZAAR) 100 MG tablet Take 1 tablet by mouth once daily  . Multiple Vitamin (MULTIVITAMIN) capsule Take by  mouth daily.   . naloxone (NARCAN) nasal spray 4 mg/0.1 mL Spray into one nostril for suspected overdose. Repeat x1, if no response after 3 minutes.  Dispense one package with 2 nasal sprays.  . ondansetron (ZOFRAN-ODT) 4 MG disintegrating tablet Take 1 tablet (4 mg total) by mouth every 8 (eight) hours as needed for nausea or vomiting.  Marland Kitchen oxycodone (ROXICODONE) 30 MG immediate release tablet Take 60 mg by mouth 4 (four) times daily.   . Oxycodone HCl 20 MG TABS Take 10 mg by mouth every 4 (four) hours as needed.   . pantoprazole (PROTONIX) 40 MG tablet Take 1 tablet by mouth twice daily  . sodium chloride 1 g tablet Take 2 tablets (2 g total) by mouth daily.  . solifenacin (VESICARE) 5 MG tablet Take 5 mg by mouth daily.  Marland Kitchen thiamine 100 MG tablet Take by mouth.  . traZODone (DESYREL) 50 MG tablet Take 1 tablet (50 mg total) by mouth at bedtime.  . vitamin B-12 (CYANOCOBALAMIN) 500 MCG tablet Take 500 mcg by mouth daily.  Marland Kitchen vortioxetine HBr (TRINTELLIX) 5 MG TABS tablet Take 5 mg by mouth daily.    Allergies: Patient is allergic to bupropion and gabapentin. Family History: Patient family history includes Alcohol abuse in her brother; Colon cancer in her sister; Healthy in her daughter, daughter, daughter, and son; Hyperlipidemia in her father; Hypertension in her brother, father, and mother. Social History:  Patient  reports that she has been smoking e-cigarettes. She has never used smokeless tobacco. She reports previous alcohol use. She reports that she does not use drugs.  Review of Systems: Constitutional: Negative for fever malaise or anorexia Cardiovascular: negative for chest pain Respiratory: negative for SOB or persistent cough Gastrointestinal: negative for abdominal pain  Objective  Vitals: BP (!) 148/88   Pulse 71   Temp 97.9 F (36.6 C) (Temporal)   Ht 5\' 5"  (1.651 m)   Wt 156 lb (70.8 kg)   SpO2 96%   BMI 25.96 kg/m  General: no acute distress , A&Ox3 Psych:  Appropriate affect, normal speech, not sedated, pleasant Cardiovascular:  RRR without murmur or gallop.  Respiratory:  Good breath sounds bilaterally, CTAB with normal respiratory effort Skin:  Warm, no rashes     Commons side effects, risks, benefits, and alternatives for medications and treatment plan prescribed today were discussed, and the patient expressed understanding of the given instructions. Patient is instructed to call or message via MyChart if he/she has any questions or concerns regarding our treatment plan. No barriers to understanding were identified. We discussed Red Flag symptoms and signs in detail. Patient expressed understanding regarding what to do in case of urgent or emergency type symptoms.   Medication list was reconciled, printed and provided to the patient in AVS. Patient instructions and summary information was reviewed with the patient as documented in the AVS. This note was prepared with assistance  of Dragon voice recognition software. Occasional wrong-word or sound-a-like substitutions may have occurred due to the inherent limitations of voice recognition software  This visit occurred during the SARS-CoV-2 public health emergency.  Safety protocols were in place, including screening questions prior to the visit, additional usage of staff PPE, and extensive cleaning of exam room while observing appropriate contact time as indicated for disinfecting solutions.   

## 2019-11-29 NOTE — Patient Instructions (Signed)
Please return in 3-6 months for CPE with pap smear.  If you have any questions or concerns, please don't hesitate to send me a message via MyChart or call the office at [phone]. Thank you for visiting with [place] today! It's our pleasure caring for you.

## 2019-11-30 ENCOUNTER — Other Ambulatory Visit: Payer: Self-pay | Admitting: Family Medicine

## 2019-12-04 ENCOUNTER — Encounter: Payer: Self-pay | Admitting: Family Medicine

## 2019-12-05 ENCOUNTER — Other Ambulatory Visit: Payer: Self-pay | Admitting: Family Medicine

## 2019-12-05 NOTE — Telephone Encounter (Signed)
  LAST APPOINTMENT DATE: 11/29/2019   NEXT APPOINTMENT DATE:@Visit  date not found  MEDICATION:pantoprazole (PROTONIX) 40 MG tablet  [place], [place] - [address]  COMMENTS: Patient asked if this could be sent in today because her stomach has been bothering her.

## 2019-12-05 NOTE — Telephone Encounter (Signed)
Medication has already been sent to the pharmacy. °

## 2019-12-09 DIAGNOSIS — G8929 Other chronic pain: Secondary | ICD-10-CM | POA: Diagnosis not present

## 2019-12-09 DIAGNOSIS — M47812 Spondylosis without myelopathy or radiculopathy, cervical region: Secondary | ICD-10-CM | POA: Diagnosis not present

## 2019-12-09 DIAGNOSIS — M545 Low back pain: Secondary | ICD-10-CM | POA: Diagnosis not present

## 2019-12-09 DIAGNOSIS — Z981 Arthrodesis status: Secondary | ICD-10-CM | POA: Diagnosis not present

## 2019-12-09 DIAGNOSIS — Z9889 Other specified postprocedural states: Secondary | ICD-10-CM | POA: Diagnosis not present

## 2019-12-09 DIAGNOSIS — M810 Age-related osteoporosis without current pathological fracture: Secondary | ICD-10-CM | POA: Diagnosis not present

## 2019-12-10 ENCOUNTER — Telehealth: Payer: Self-pay

## 2019-12-10 NOTE — Telephone Encounter (Signed)
Received call from Shannon Young at Pain management. They are reviewing records to see if they can get her in. Need to know : 1: what she is currently taking for pain management.  2. If she wants to get off of all pain meds at this time  Called pt l/m to called get info. When we find out need to call and let office know at number below.  [phone] option 5

## 2019-12-11 NOTE — Telephone Encounter (Signed)
Shannon Young, Did you already address these questions with them yesterday am? Thanks, Dr. Jonni Sanger

## 2019-12-12 NOTE — Telephone Encounter (Signed)
I sent the call to Shannon Young to speak with the office since she was more familiar with the pt and their history.

## 2019-12-12 NOTE — Telephone Encounter (Signed)
See below. Has this been handled already? Thanks, Dr. Jonni Sanger

## 2019-12-12 NOTE — Telephone Encounter (Signed)
Please call patient need to get following information from patient and call number below to give information.   Received call from betty at Pain management. They are reviewing records to see if they can get her in. Need to know : 1: what she is currently taking for pain management.  2. If she wants to get off of all pain meds at this time   Called pt l/m to called get info. When we find out need to call and let office know at number below.  [phone] option 5

## 2019-12-13 ENCOUNTER — Other Ambulatory Visit: Payer: Self-pay

## 2019-12-13 ENCOUNTER — Other Ambulatory Visit (INDEPENDENT_AMBULATORY_CARE_PROVIDER_SITE_OTHER): Payer: Medicare Other

## 2019-12-13 DIAGNOSIS — E871 Hypo-osmolality and hyponatremia: Secondary | ICD-10-CM | POA: Diagnosis not present

## 2019-12-13 NOTE — Telephone Encounter (Signed)
LVM for patient to call the office

## 2019-12-13 NOTE — Addendum Note (Signed)
Addended by: Betti Cruz on: 12/13/2019 03:47 PM   Modules accepted: Orders

## 2019-12-14 LAB — BASIC METABOLIC PANEL
BUN: 15 mg/dL (ref 7–25)
CO2: 25 mmol/L (ref 20–32)
Calcium: 9.1 mg/dL (ref 8.6–10.4)
Chloride: 96 mmol/L — ABNORMAL LOW (ref 98–110)
Creat: 0.93 mg/dL (ref 0.50–0.99)
Glucose, Bld: 125 mg/dL — ABNORMAL HIGH (ref 65–99)
Potassium: 4.4 mmol/L (ref 3.5–5.3)
Sodium: 131 mmol/L — ABNORMAL LOW (ref 135–146)

## 2019-12-16 ENCOUNTER — Other Ambulatory Visit: Payer: Self-pay | Admitting: Student

## 2019-12-16 DIAGNOSIS — M810 Age-related osteoporosis without current pathological fracture: Secondary | ICD-10-CM

## 2019-12-16 NOTE — Progress Notes (Signed)
Please call patient: I have reviewed his/her lab results. Not sure why this was rechecked?? Sodium remains stable. Continue salt tabs.

## 2019-12-17 ENCOUNTER — Other Ambulatory Visit: Payer: Self-pay | Admitting: Student

## 2019-12-17 DIAGNOSIS — Z9889 Other specified postprocedural states: Secondary | ICD-10-CM

## 2019-12-17 DIAGNOSIS — M810 Age-related osteoporosis without current pathological fracture: Secondary | ICD-10-CM

## 2019-12-19 ENCOUNTER — Telehealth: Payer: Self-pay | Admitting: Nurse Practitioner

## 2019-12-19 NOTE — Telephone Encounter (Signed)
Phone call to patient to verify medication list and allergies for myelogram procedure. Pt instructed to hold trazodone and trintellix for 48hrs prior to myelogram appointment time. Pt verbalized understanding. Pre and post procedure instructions reviewed with pt.

## 2019-12-20 DIAGNOSIS — F332 Major depressive disorder, recurrent severe without psychotic features: Secondary | ICD-10-CM | POA: Diagnosis not present

## 2019-12-25 ENCOUNTER — Ambulatory Visit
Admission: RE | Admit: 2019-12-25 | Discharge: 2019-12-25 | Disposition: A | Discharge status: Home / Self Care | Payer: Medicare Other | Source: Ambulatory Visit | Attending: Student | Admitting: Student

## 2019-12-25 ENCOUNTER — Other Ambulatory Visit: Payer: Self-pay

## 2019-12-25 DIAGNOSIS — Z9889 Other specified postprocedural states: Secondary | ICD-10-CM

## 2019-12-25 DIAGNOSIS — M4804 Spinal stenosis, thoracic region: Secondary | ICD-10-CM | POA: Diagnosis not present

## 2019-12-25 DIAGNOSIS — M4326 Fusion of spine, lumbar region: Secondary | ICD-10-CM | POA: Diagnosis not present

## 2019-12-25 DIAGNOSIS — M810 Age-related osteoporosis without current pathological fracture: Secondary | ICD-10-CM

## 2019-12-25 DIAGNOSIS — M545 Low back pain: Secondary | ICD-10-CM | POA: Diagnosis not present

## 2019-12-25 DIAGNOSIS — M5124 Other intervertebral disc displacement, thoracic region: Secondary | ICD-10-CM | POA: Diagnosis not present

## 2019-12-25 DIAGNOSIS — M4802 Spinal stenosis, cervical region: Secondary | ICD-10-CM | POA: Diagnosis not present

## 2019-12-25 DIAGNOSIS — M546 Pain in thoracic spine: Secondary | ICD-10-CM | POA: Diagnosis not present

## 2019-12-25 DIAGNOSIS — M50323 Other cervical disc degeneration at C6-C7 level: Secondary | ICD-10-CM | POA: Diagnosis not present

## 2019-12-25 MED ORDER — MEPERIDINE HCL 50 MG/ML IJ SOLN
50.0000 mg | Freq: Once | INTRAMUSCULAR | Status: AC
  Administered 2019-12-25: 50 mg via INTRAMUSCULAR

## 2019-12-25 MED ORDER — IOPAMIDOL (ISOVUE-M 300) INJECTION 61%
10.0000 mL | Freq: Once | INTRAMUSCULAR | Status: AC | PRN
  Administered 2019-12-25: 10 mL via INTRATHECAL

## 2019-12-25 MED ORDER — ONDANSETRON HCL 4 MG/2ML IJ SOLN
4.0000 mg | Freq: Once | INTRAMUSCULAR | Status: AC
  Administered 2019-12-25: 4 mg via INTRAMUSCULAR

## 2019-12-25 MED ORDER — DIAZEPAM 5 MG PO TABS
10.0000 mg | ORAL_TABLET | Freq: Once | ORAL | Status: AC
  Administered 2019-12-25: 10 mg via ORAL

## 2019-12-25 NOTE — Discharge Instr - Other Orders (Signed)
Pt back in nurses station. Alert and talking in complete sentences. Pt denies pain at this time.

## 2019-12-25 NOTE — Progress Notes (Signed)
Pt reports she has been off of her Trazodone and Trintellix for 48 hours.

## 2019-12-25 NOTE — Progress Notes (Signed)
Dr. Jeralyn Ruths requested pt to receive pain medication and nausea medication prior to procedure. MD aware that pt had received 10mg  of valium. See [facility]

## 2019-12-25 NOTE — Discharge Instructions (Signed)
Myelogram Discharge Instructions  1. Go home and rest quietly for the next 24 hours.  It is important to lie flat for the next 24 hours.  Get up only to go to the restroom.  You may lie in the bed or on a couch on your back, your stomach, your left side or your right side.  You may have one pillow under your head.  You may have pillows between your knees while you are on your side or under your knees while you are on your back.  2. DO NOT drive today.  Recline the seat as far back as it will go, while still wearing your seat belt, on the way home.  3. You may get up to go to the bathroom as needed.  You may sit up for 10 minutes to eat.  You may resume your normal diet and medications unless otherwise indicated.  Drink lots of extra fluids today and tomorrow.  4. The incidence of headache, nausea, or vomiting is about 5% (one in 20 patients).  If you develop a headache, lie flat and drink plenty of fluids until the headache goes away.  Caffeinated beverages may be helpful.  If you develop severe nausea and vomiting or a headache that does not go away with flat bed rest, call [phone].  5. You may resume normal activities after your 24 hours of bed rest is over; however, do not exert yourself strongly or do any heavy lifting tomorrow. If when you get up you have a headache when standing, go back to bed and force fluids for another 24 hours.  6. Call your physician for a follow-up appointment.  The results of your myelogram will be sent directly to your physician by the following day.  7. If you have any questions or if complications develop after you arrive home, please call [phone].  Discharge instructions have been explained to the patient.  The patient, or the person responsible for the patient, fully understands these instructions  YOU MAY RESUME YOUR TRAZODONE AND [place] 12/26/2019 AT 1PM

## 2020-01-01 ENCOUNTER — Other Ambulatory Visit: Payer: Self-pay

## 2020-01-01 ENCOUNTER — Other Ambulatory Visit: Payer: Medicare Other

## 2020-01-01 ENCOUNTER — Telehealth: Payer: Self-pay | Admitting: Family Medicine

## 2020-01-01 DIAGNOSIS — M4325 Fusion of spine, thoracolumbar region: Secondary | ICD-10-CM

## 2020-01-01 DIAGNOSIS — M4120 Other idiopathic scoliosis, site unspecified: Secondary | ICD-10-CM

## 2020-01-01 DIAGNOSIS — T8484XA Pain due to internal orthopedic prosthetic devices, implants and grafts, initial encounter: Secondary | ICD-10-CM

## 2020-01-01 DIAGNOSIS — Z981 Arthrodesis status: Secondary | ICD-10-CM

## 2020-01-01 NOTE — Telephone Encounter (Signed)
Spoke with PCP. Dr. Jonni Sanger said that script needed to come from ordering provider.

## 2020-01-01 NOTE — Telephone Encounter (Signed)
Patient is having a CT scan done tomorrow and its going to be 40 mins long. Patient wants to know if Dr. Jonni Sanger would send in Valium in for her it really helped her for her last CT scan. Please advise.

## 2020-01-02 DIAGNOSIS — M4325 Fusion of spine, thoracolumbar region: Secondary | ICD-10-CM | POA: Diagnosis not present

## 2020-01-02 DIAGNOSIS — R600 Localized edema: Secondary | ICD-10-CM | POA: Diagnosis not present

## 2020-01-02 DIAGNOSIS — Z981 Arthrodesis status: Secondary | ICD-10-CM | POA: Diagnosis not present

## 2020-01-02 DIAGNOSIS — M5124 Other intervertebral disc displacement, thoracic region: Secondary | ICD-10-CM | POA: Diagnosis not present

## 2020-01-02 LAB — BASIC METABOLIC PANEL
BUN: 12 mg/dL (ref 7–25)
CO2: 29 mmol/L (ref 20–32)
Calcium: 9.5 mg/dL (ref 8.6–10.4)
Chloride: 99 mmol/L (ref 98–110)
Creat: 0.85 mg/dL (ref 0.50–0.99)
Glucose, Bld: 99 mg/dL (ref 65–99)
Potassium: 4.8 mmol/L (ref 3.5–5.3)
Sodium: 134 mmol/L — ABNORMAL LOW (ref 135–146)

## 2020-01-02 LAB — CBC WITH DIFFERENTIAL/PLATELET
Absolute Monocytes: 469 cells/uL (ref 200–950)
Basophils Absolute: 41 cells/uL (ref 0–200)
Basophils Relative: 0.9 %
Eosinophils Absolute: 267 cells/uL (ref 15–500)
Eosinophils Relative: 5.8 %
HCT: 36.4 % (ref 35.0–45.0)
Hemoglobin: 12.3 g/dL (ref 11.7–15.5)
Lymphs Abs: 1449 cells/uL (ref 850–3900)
MCH: 30.8 pg (ref 27.0–33.0)
MCHC: 33.8 g/dL (ref 32.0–36.0)
MCV: 91 fL (ref 80.0–100.0)
MPV: 9 fL (ref 7.5–12.5)
Monocytes Relative: 10.2 %
Neutro Abs: 2374 cells/uL (ref 1500–7800)
Neutrophils Relative %: 51.6 %
Platelets: 306 10*3/uL (ref 140–400)
RBC: 4 10*6/uL (ref 3.80–5.10)
RDW: 12.9 % (ref 11.0–15.0)
Total Lymphocyte: 31.5 %
WBC: 4.6 10*3/uL (ref 3.8–10.8)

## 2020-01-02 LAB — C-REACTIVE PROTEIN: CRP: 1.2 mg/L (ref <8.0)

## 2020-01-02 LAB — SEDIMENTATION RATE: Sed Rate: 2 mm/h (ref 0–30)

## 2020-01-03 ENCOUNTER — Ambulatory Visit
Admission: RE | Admit: 2020-01-03 | Discharge: 2020-01-03 | Disposition: A | Discharge status: Home / Self Care | Payer: Medicare Other | Source: Ambulatory Visit | Attending: Student | Admitting: Student

## 2020-01-03 ENCOUNTER — Other Ambulatory Visit: Payer: Self-pay

## 2020-01-03 DIAGNOSIS — M810 Age-related osteoporosis without current pathological fracture: Secondary | ICD-10-CM

## 2020-01-03 DIAGNOSIS — M85851 Other specified disorders of bone density and structure, right thigh: Secondary | ICD-10-CM | POA: Diagnosis not present

## 2020-01-03 DIAGNOSIS — Z780 Asymptomatic menopausal state: Secondary | ICD-10-CM | POA: Diagnosis not present

## 2020-01-08 DIAGNOSIS — M5440 Lumbago with sciatica, unspecified side: Secondary | ICD-10-CM | POA: Diagnosis not present

## 2020-01-08 DIAGNOSIS — M961 Postlaminectomy syndrome, not elsewhere classified: Secondary | ICD-10-CM | POA: Diagnosis not present

## 2020-01-10 ENCOUNTER — Ambulatory Visit: Payer: Medicare Other | Admitting: Family Medicine

## 2020-01-13 ENCOUNTER — Encounter: Payer: Self-pay | Admitting: Internal Medicine

## 2020-01-13 ENCOUNTER — Other Ambulatory Visit: Payer: Self-pay

## 2020-01-13 ENCOUNTER — Ambulatory Visit (INDEPENDENT_AMBULATORY_CARE_PROVIDER_SITE_OTHER): Payer: Medicare Other | Admitting: Internal Medicine

## 2020-01-13 VITALS — BP 112/72 | HR 76 | Ht 65.0 in | Wt 152.4 lb

## 2020-01-13 DIAGNOSIS — E871 Hypo-osmolality and hyponatremia: Secondary | ICD-10-CM

## 2020-01-13 DIAGNOSIS — M810 Age-related osteoporosis without current pathological fracture: Secondary | ICD-10-CM

## 2020-01-13 MED ORDER — TERIPARATIDE 620 MCG/2.48ML ~~LOC~~ SOPN: 20.0000 ug | PEN_INJECTOR | Freq: Every day | SUBCUTANEOUS | 11 refills | Status: AC

## 2020-01-13 NOTE — Patient Instructions (Signed)
-   Calcium citrate 1200 mg daily  - Vitamin D3 1000 iu daily

## 2020-01-13 NOTE — Progress Notes (Signed)
Name: Shannon Young  MRN/ DOB: [id number], [birth year]    Age/ Sex: 64 y.o., female     PCP: Shannon Arnt, Shannon Young   Reason for Endocrinology Evaluation: Hyponatremia     Initial Endocrinology Clinic Visit: 01/29/2019    PATIENT IDENTIFIER: Shannon Young is a 64 y.o., female, female with a past medical history of HTN, alcohol abuse, depression and opioid dependence . She has followed with [facility] since 01/29/2019 for consultative assistance with management of her hyponatremia.   HYPONATREMIA HISTORY: The patient was first diagnosed with hyponatremia since 2019. This was though to be multifactorial given SSRI use, opiod pain meds and oxcarbazepine use.  We recommended fluid restriction, increased salt tablets She was supposed to return for cosyntropin stimulation test but did not make it back.  Sodium improved with changing psychiatric medications   Stopped ETOH use 08/2017  Sees Psychiatry ( [place] )    Osteoporosis History:  She has been diagnosed with osteoporosis 12/2019  based on a T-score of -3. At the distal forearm. She has had multiple back surgeries and it was recommended by Ortho to proceed with [employer].   SUBJECTIVE:    Today (01/14/2020):  Shannon Young is here to discuss osteoporosis management. She is accompanied by her spouse today.   She has a hx of multiple back surgeries and failed back syndrome. She has been seen by multiple  Providers for her back through [facility], as well as the [facility] [place].    Pt was diagnosed with osteoporosis: 2021  Menarche at age :  65 Menopausal at age : 58  Fracture Hx: right  foot 2012, left shoulder fracture Hx of HRT: yes stopped around age 71 FH of osteoporosis or hip fracture:  no Prior Hx of anti-estrogenic therapy : no Prior Hx of anti-resorptive therapy : no     ROS:  As per HPI.   HISTORY:  Past Medical History:  Past Medical History:  Diagnosis Date  . Alcoholic hepatitis  7/93/9030  . Delirium tremens ([place]) 10/17/2017   Acute alcohol and benzo w/d: 08/2017  . Depression   . GERD (gastroesophageal reflux disease)   . Heart murmur   . Hyperlipidemia   . Hypertension   . Low sodium levels   . Osteopenia   . Tubular adenoma of colon 08/24/2018   Colonoscopy 07/2018, Dr. Laurier Nancy, repeat in 7 years.   . Vision abnormalities    Past Surgical History:  Past Surgical History:  Procedure Laterality Date  . ANAL RECTAL MANOMETRY N/A 11/26/2018   Procedure: ANO RECTAL MANOMETRY;  Surgeon: Thornton Park, Shannon Young;  Location: [facility];  Service: Gastroenterology;  Laterality: N/A;  . BACK SURGERY  2008,2017,2019   x 3, electrical implant and explant and attempt at insertion of pain pump  . BACK SURGERY  04/11/2019  . BUNIONECTOMY  2003  . COLONOSCOPY    . MANDIBLE SURGERY  2005   repaired for proper function of mandible  . POSTERIOR LUMBAR VERTEBRAE EXCISION  09/15/2017   spacers put in L-4- L5 or 6 per pt statement  . TOTAL SHOULDER REPLACEMENT Left 2015    Social History:  reports that she has been smoking e-cigarettes. She has never used smokeless tobacco. She reports previous alcohol use. She reports that she does not use drugs. Family History:  Family History  Problem Relation Age of Onset  . Hypertension Mother   . Hypertension Father   . Hyperlipidemia Father   . Colon  cancer Sister        ? or stomach  . Alcohol abuse Brother   . Hypertension Brother   . Healthy Daughter   . Healthy Son   . Healthy Daughter   . Healthy Daughter   . Colon polyps Neg Hx   . Esophageal cancer Neg Hx   . Rectal cancer Neg Hx   . Stomach cancer Neg Hx      HOME MEDICATIONS: Allergies as of 01/13/2020      Reactions   Gabapentin Other (See Comments)   Pt states that she has jerking movements and arms and fingers go numb; spasms, weakness   Bupropion       Medication List       Accurate as of January 13, 2020 11:59 PM. If you have any questions, ask  your nurse or doctor.        STOP taking these medications   baclofen 10 MG tablet Commonly known as: LIORESAL Stopped by: Shannon Sciara, Shannon Young     TAKE these medications   atorvastatin 20 MG tablet Commonly known as: LIPITOR TAKE 1 TABLET BY MOUTH AT BEDTIME   cholecalciferol 25 MCG (1000 UNIT) tablet Commonly known as: VITAMIN D3 Take 1,000 Units by mouth daily.   fentaNYL 75 MCG/HR Commonly known as: [place] 1 patch onto the skin every 3 (three) days.   fluticasone 50 MCG/ACT nasal spray Commonly known as: FLONASE Place 1 spray into both nostrils as needed.   folic acid 1 MG tablet Commonly known as: FOLVITE Take by mouth.   Glucosamine 500 MG Caps Take by mouth 2 (two) times daily.   losartan 100 MG tablet Commonly known as: COZAAR Take 1 tablet by mouth once daily   multivitamin capsule Take by mouth daily.   Narcan 4 MG/0.1ML Liqd nasal spray kit Generic drug: naloxone Spray into one nostril for suspected overdose. Repeat x1, if no response after 3 minutes.  Dispense one package with 2 nasal sprays.   ondansetron 4 MG disintegrating tablet Commonly known as: ZOFRAN-ODT Take 1 tablet (4 mg total) by mouth every 8 (eight) hours as needed for nausea or vomiting.   Oxycodone HCl 20 MG Tabs Take 10 mg by mouth every 4 (four) hours as needed.   oxycodone 30 MG immediate release tablet Commonly known as: ROXICODONE Take 60 mg by mouth 4 (four) times daily.   pantoprazole 40 MG tablet Commonly known as: PROTONIX Take 1 tablet by mouth twice daily   sodium chloride 1 g tablet Take 2 tablets (2 g total) by mouth daily.   solifenacin 5 MG tablet Commonly known as: VESICARE Take 5 mg by mouth daily.   Teriparatide (Recombinant) 620 MCG/2.48ML Sopn Commonly known as: Forteo Inject 20 mcg into the skin daily. Started by: Shannon Sciara, Shannon Young   thiamine 100 MG tablet Take by mouth.   traZODone 50 MG tablet Commonly known as:  DESYREL Take 1 tablet (50 mg total) by mouth at bedtime.   Trintellix 5 MG Tabs tablet Generic drug: vortioxetine HBr Take 5 mg by mouth daily.   vitamin B-12 500 MCG tablet Commonly known as: CYANOCOBALAMIN Take 500 mcg by mouth daily.         OBJECTIVE:   PHYSICAL EXAM: VS: BP 112/72 (BP Location: Left Arm, Patient Position: Standing, Cuff Size: Normal)   Pulse 76   Ht '5\' 5"'  (1.651 m)   Wt 152 lb 6.4 oz (69.1 kg)   SpO2 97%   BMI 25.36  kg/m    EXAM: General: Pt appears well and is in NAD  Neck: General: Supple without adenopathy. Thyroid: Thyroid size normal.  No goiter or nodules appreciated. No thyroid bruit.  Lungs: Clear with good BS bilat with no rales, rhonchi, or wheezes  Heart: Auscultation: RRR.  Abdomen: Normoactive bowel sounds, soft, nontender, without masses or organomegaly palpable  Back: Kyphosis noted, as well as a localized swelling to the right of the spine.   Extremities:  BL LE: No pretibial edema   Mental Status: Judgment, insight: Intact Orientation: Oriented to time, place, and person Mood and affect: No depression, anxiety, or agitation     DATA REVIEWED: Results for KAYLEN, MOTL (MRN [id number]) as of 01/14/2020 19:30  Ref. Range 01/01/2020 14:31  Sodium Latest Ref Range: 135 - 146 mmol/L 134 (L)  Potassium Latest Ref Range: 3.5 - 5.3 mmol/L 4.8  Chloride Latest Ref Range: 98 - 110 mmol/L 99  CO2 Latest Ref Range: 20 - 32 mmol/L 29  Glucose Latest Ref Range: 65 - 99 mg/dL 99  BUN Latest Ref Range: 7 - 25 mg/dL 12  Creatinine Latest Ref Range: 0.50 - 0.99 mg/dL 0.85  Calcium Latest Ref Range: 8.6 - 10.4 mg/dL 9.5  BUN/Creatinine Ratio Latest Ref Range: 6 - 22 (calc) NOT APPLICABLE  CRP Latest Ref Range: <8.0 mg/L 1.2    DXA 01/03/2020  The BMD measured at Forearm Radius 33% is 0.623 g/cm2 with a T-score of -3.0. This patient is considered osteoporotic according to [facility]) criteria. Lumbar spine was not  utilized due to advanced degenerative changes. Scan quality good.  Site Region Measured Date Measured Age YA BMD Significant CHANGE T-score Left Forearm Radius 33% 01/03/2020 62.3 -3.0 0.623 g/cm2  DualFemur Neck Right 01/03/2020 62.3 -1.7 0.800 g/cm2  DualFemur Total Mean 01/03/2020 62.3 -1.3 0.839 g/cm2    ASSESSMENT / PLAN / RECOMMENDATIONS:   1. Osteoporosis :  - Multifactorial - I agree with proceeding with Forteo , she understands this will be daily injection for 2 years, will discuss long term management at the 2 yr mark. - We discussed black box warning , pt is a low risk due to no previous hx of cancer or radiation exposure     Medications  Start Calcium citrate 1200 mg daily  Continue Vitamin D3 1000 iu daily   2. Hyponatremia :   - Improved with discontinuation of Oxcarbazepine and Cymbalta - Sodium has been stable > 130 mEq/L - Continue fluid restriction and salt tablets   F/U in 6 months     Signed electronically by: Shannon Guise, Shannon Young  [facility]  [facility] [address] [place], [place] [address] Phone: [phone] FAX: [phone]      CC: Shannon Young, [place] [address] Phone: [phone]  Fax: [phone]   Return to Endocrinology clinic as below: Future Appointments  Date Time Provider [place]  07/17/2020  2:00 PM Oretta Berkland, Melanie Crazier, Shannon Young [facility] None

## 2020-01-14 ENCOUNTER — Telehealth: Payer: Self-pay | Admitting: Nutrition

## 2020-01-14 DIAGNOSIS — E871 Hypo-osmolality and hyponatremia: Secondary | ICD-10-CM | POA: Insufficient documentation

## 2020-01-14 DIAGNOSIS — M810 Age-related osteoporosis without current pathological fracture: Secondary | ICD-10-CM | POA: Insufficient documentation

## 2020-01-14 NOTE — Telephone Encounter (Signed)
Patient was trained on how to inject forteo, side effects, storage, and cost assistance.  She will call Forteo connect to sign up for financial assistance with this.  Telephone number given.  She was given a Building surveyor with above information and travel kit and BD nano pen needles   Pt. Had no final questions

## 2020-01-15 ENCOUNTER — Telehealth: Payer: Self-pay

## 2020-01-15 ENCOUNTER — Telehealth: Payer: Self-pay | Admitting: Family Medicine

## 2020-01-15 ENCOUNTER — Other Ambulatory Visit: Payer: Self-pay

## 2020-01-15 DIAGNOSIS — G894 Chronic pain syndrome: Secondary | ICD-10-CM

## 2020-01-15 DIAGNOSIS — M4325 Fusion of spine, thoracolumbar region: Secondary | ICD-10-CM

## 2020-01-15 DIAGNOSIS — M4120 Other idiopathic scoliosis, site unspecified: Secondary | ICD-10-CM

## 2020-01-15 DIAGNOSIS — Z981 Arthrodesis status: Secondary | ICD-10-CM

## 2020-01-15 DIAGNOSIS — T8484XA Pain due to internal orthopedic prosthetic devices, implants and grafts, initial encounter: Secondary | ICD-10-CM

## 2020-01-15 NOTE — Telephone Encounter (Signed)
Referral placed.

## 2020-01-15 NOTE — Telephone Encounter (Signed)
OK for referral? Please advise 

## 2020-01-15 NOTE — Telephone Encounter (Signed)
Caller Name Declined to provide Caller Phone Number [phone] Patient Name Shannon Young Patient DOB Dec 24, 1955 Call Type Message Only Information Provided Reason for Call Request to Speak to a Physician Initial Comment Caller states she needs a referral sent to [facility]

## 2020-01-15 NOTE — Telephone Encounter (Signed)
yes

## 2020-01-15 NOTE — Telephone Encounter (Signed)
PA has been initiated by calling [facility] pharmacy for Forteo injection 620/2.48ml.  Clinical questions were answered, and all pertinent information was provided as needed.  Determination should be received via fax within 24-72 hours.  Reference # for the PA and call was [id number]

## 2020-01-16 IMAGING — MR MR HEAD W/O CM
8 series · 48 of 48 positions shown · non-contrast
Comparison: None available.

CLINICAL DATA: Initial evaluation for memory loss with balance
issues for 6 months.

EXAM:
MRI HEAD WITHOUT CONTRAST
TECHNIQUE: Multiplanar, multiecho pulse sequences of the brain and surrounding
structures were obtained without intravenous contrast.

[Series 3: T1 · sagittal · 5.0mm · 0.45mm/px · 3 of 19 slices shown]
[im 1/19]
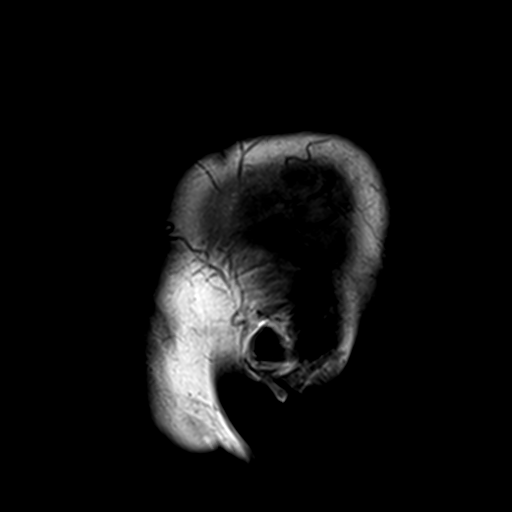
[im 10/19]
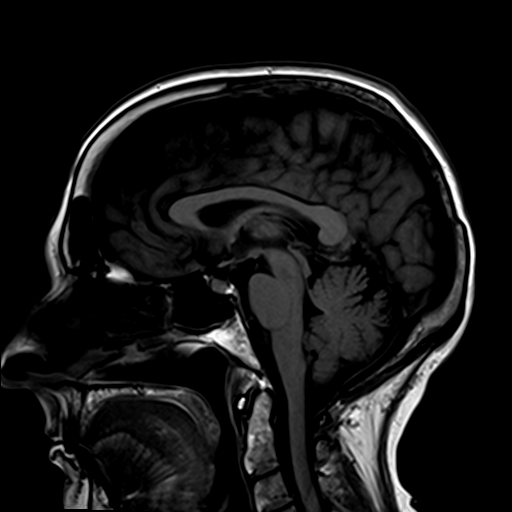
[im 19/19]
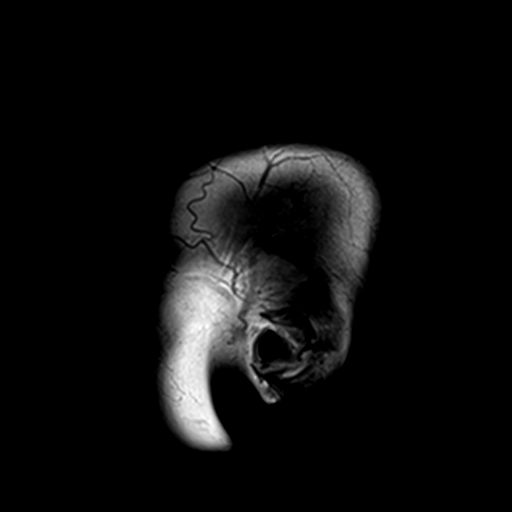

[Series 4: DWI · axial · 3.0mm · 1.80mm/px · z∈[-69,+78]mm · 11 of 99 slices shown (1 of 2)]
[im 1/99]
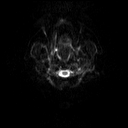
[im 10/99]
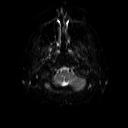
[im 20/99]
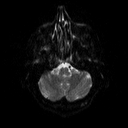
[im 30/99]
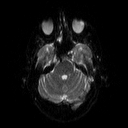
[im 40/99]
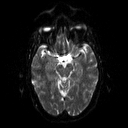
[im 50/99]
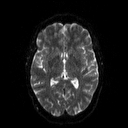
[im 59/99]
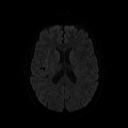
[im 69/99]
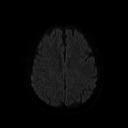
[im 79/99]
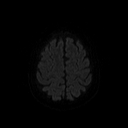
[im 89/99]
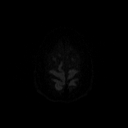
[im 99/99]
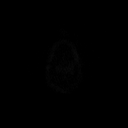

[Series 5: DWI · axial · 3.0mm · 1.80mm/px · z∈[-69,+78]mm · 4 of 43 slices shown (2 of 2)]
[im 1/43]
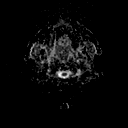
[im 15/43]
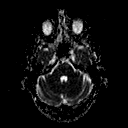
[im 29/43]
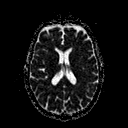
[im 43/43]
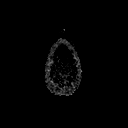

[Series 6: T2 · axial · 5.0mm · 0.60mm/px · z∈[-68,+78]mm · 2 of 22 slices shown (1 of 2)]
[im 1/22]
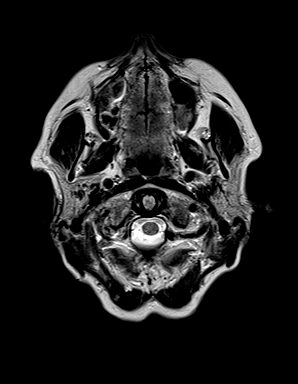
[im 22/22]
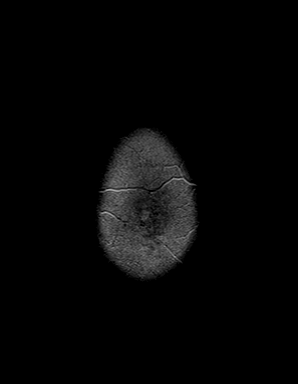

[Series 7: FLAIR · axial · 3.0mm · 0.45mm/px · z∈[-63,+72]mm · 3 of 30 slices shown]
[im 1/30]
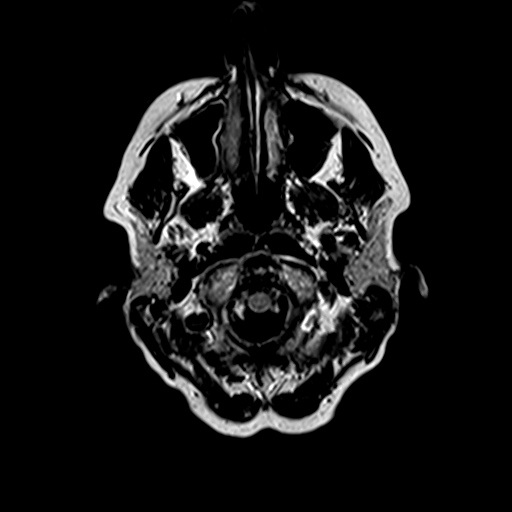
[im 15/30]
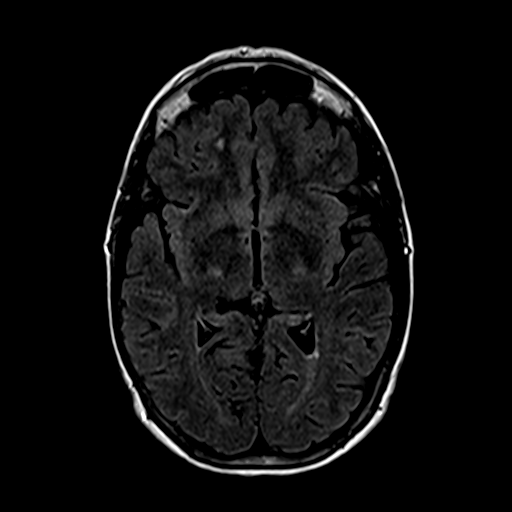
[im 30/30]
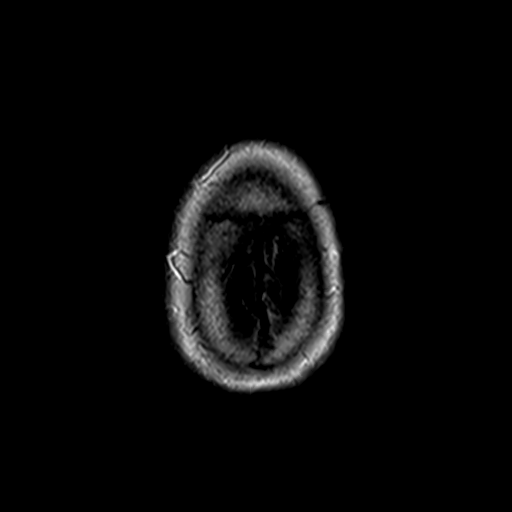

[Series 9: swi_images · axial · 2.0mm · 0.90mm/px · z∈[-74,+84]mm · 8 of 80 slices shown]
[im 1/80]
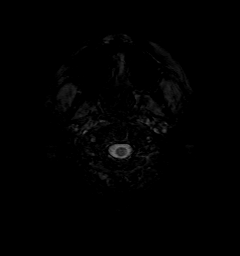
[im 12/80]
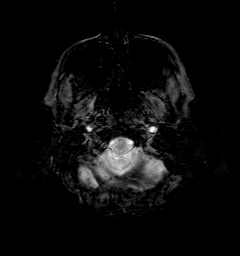
[im 23/80]
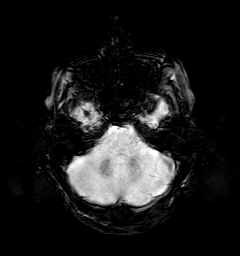
[im 34/80]
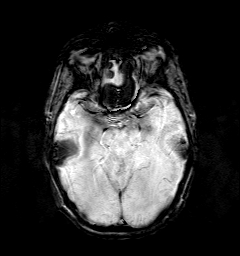
[im 46/80]
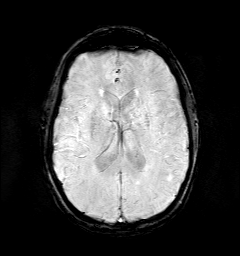
[im 57/80]
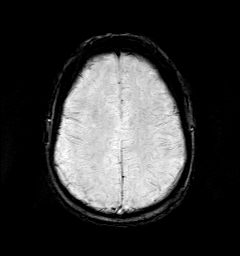
[im 68/80]
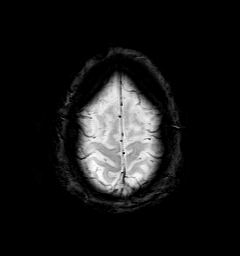
[im 80/80]
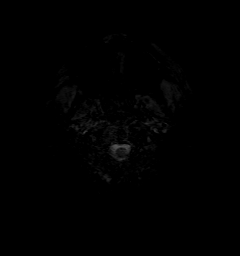

[Series 10: t1_mpr_tra · axial · 1.0mm · 0.72mm/px · z∈[-66,+76]mm · 15 of 144 slices shown]
[im 1/144]
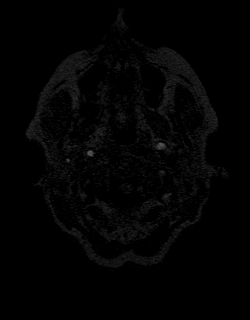
[im 11/144]
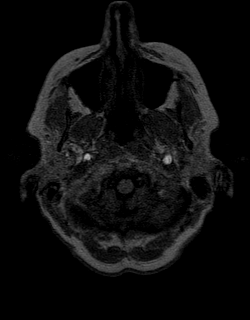
[im 21/144]
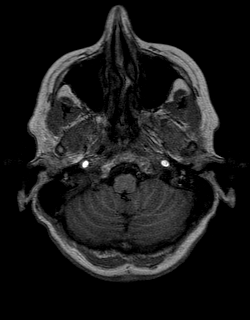
[im 31/144]
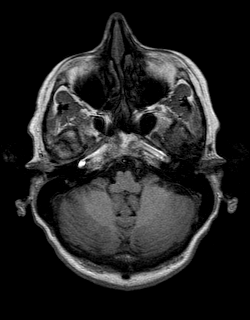
[im 41/144]
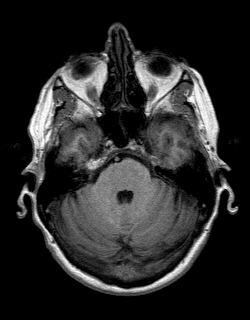
[im 52/144]
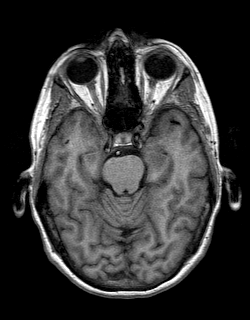
[im 62/144]
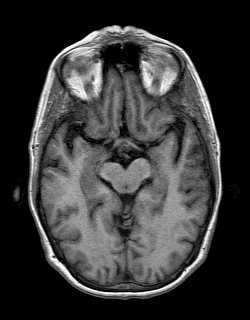
[im 72/144]
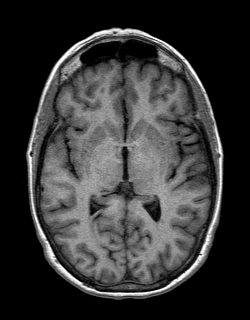
[im 82/144]
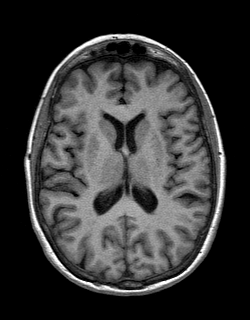
[im 92/144]
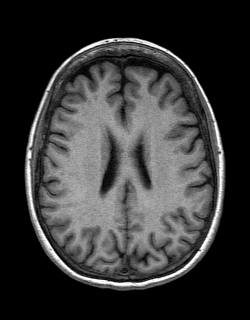
[im 103/144]
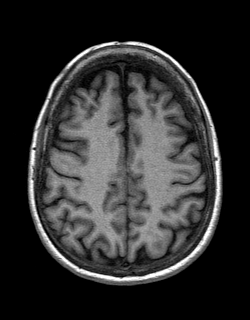
[im 113/144]
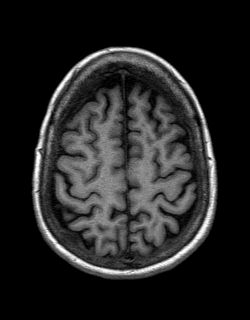
[im 123/144]
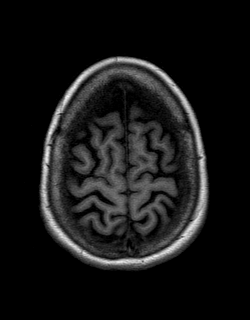
[im 133/144]
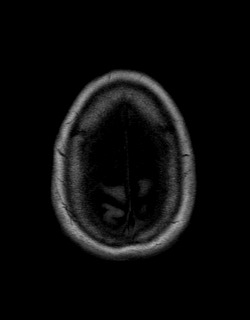
[im 144/144]
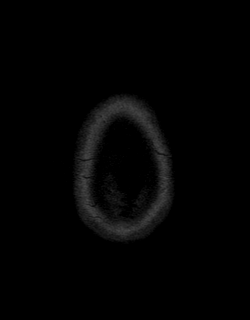

[Series 11: T2 · coronal · 5.0mm · 0.45mm/px · 2 of 23 slices shown (2 of 2)]
[im 1/23]
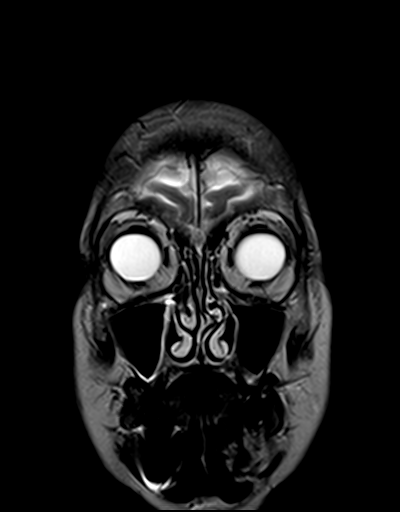
[im 23/23]
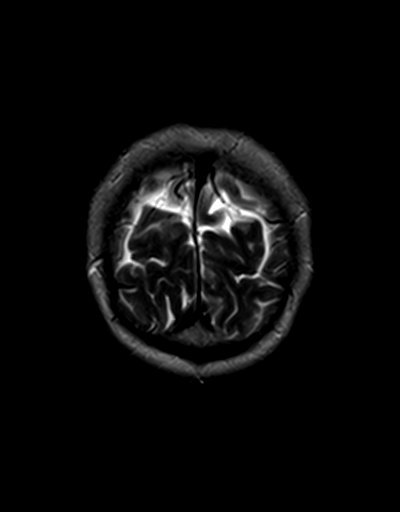

[48 of 48 positions shown; findings below may reference images not displayed]

FINDINGS: Brain: Age-related cerebral volume loss. Scattered T2/FLAIR
hyperintensity noted within the periventricular, deep, and
subcortical white matter both cerebral hemispheres, nonspecific, but
mild for age. No abnormal foci of restricted diffusion to suggest
acute or subacute ischemia. Gray-white matter differentiation
maintained. No evidence for remote cortical infarction. No foci of
susceptibility artifact to suggest acute or chronic intracranial
hemorrhage.

No mass lesion, midline shift or mass effect. No hydrocephalus. No
extra-axial fluid collection. Normal pituitary gland. Midline
structures intact and normal.

Vascular: Major intracranial vascular flow voids are well
maintained.

Skull and upper cervical spine: Craniocervical junction within
normal limits. Upper cervical spine normal. Bone marrow signal
intensity mildly heterogeneous without focal marrow replacing
lesion. No scalp soft tissue abnormality.

Sinuses/Orbits: Globes and orbital soft tissues within normal
limits. Mild scattered mucosal thickening throughout the paranasal
sinuses. No air-fluid level to suggest acute sinusitis. Small left
mastoid effusion. Inner ear structures normal.

Other: None.
IMPRESSION: 1. No acute intracranial abnormality.
2. Mild cerebral white matter changes, nonspecific, but most
commonly related to chronic microvascular ischemic disease. Primary
differential considerations include sequelae of complicated
migraine, prior infectious or inflammatory process, or vasculitis.
Findings are not typical for demyelinating disease.
3. Small left mastoid effusion, likely benign/sterile. Correlation
with physical exam and symptomatology recommended.

## 2020-01-20 NOTE — Telephone Encounter (Signed)
Patient called asking about status for Forteo and asked if someone could give her a call to discuss. Ph# [phone]

## 2020-01-21 NOTE — Telephone Encounter (Signed)
Spoke with patient and explained to her that this paperwork was received over the weekend, so the paperwork is being worked on. Pt verbalized understanding.

## 2020-01-27 NOTE — Telephone Encounter (Signed)
Received fax from [employer] that staes the PA for this patient has been approved, and because she is insured through a Human resources officer, she is not eligible for [employer].  Additionally, a Surveyor, minerals will be reaching out to the patient to relay the results of this PA. They will also be contacting the patient to confirm the pharmacy that will be dispensing the medication. The prescription will be forwarded to pt's pharmacy of choice.

## 2020-01-28 DIAGNOSIS — G894 Chronic pain syndrome: Secondary | ICD-10-CM | POA: Diagnosis not present

## 2020-01-28 DIAGNOSIS — G8929 Other chronic pain: Secondary | ICD-10-CM | POA: Diagnosis not present

## 2020-01-28 DIAGNOSIS — M4325 Fusion of spine, thoracolumbar region: Secondary | ICD-10-CM | POA: Diagnosis not present

## 2020-01-28 DIAGNOSIS — M810 Age-related osteoporosis without current pathological fracture: Secondary | ICD-10-CM | POA: Diagnosis not present

## 2020-01-28 DIAGNOSIS — E559 Vitamin D deficiency, unspecified: Secondary | ICD-10-CM | POA: Diagnosis not present

## 2020-01-28 DIAGNOSIS — Z79899 Other long term (current) drug therapy: Secondary | ICD-10-CM | POA: Diagnosis not present

## 2020-01-28 DIAGNOSIS — M129 Arthropathy, unspecified: Secondary | ICD-10-CM | POA: Diagnosis not present

## 2020-01-28 DIAGNOSIS — M961 Postlaminectomy syndrome, not elsewhere classified: Secondary | ICD-10-CM | POA: Diagnosis not present

## 2020-01-28 LAB — CBC: RBC: 4.21 (ref 3.87–5.11)

## 2020-01-28 LAB — CBC AND DIFFERENTIAL
HCT: 40 (ref 36–46)
Hemoglobin: 12.8 (ref 12.0–16.0)
WBC: 4.5

## 2020-01-28 LAB — VITAMIN D 25 HYDROXY (VIT D DEFICIENCY, FRACTURES): Vit D, 25-Hydroxy: 47.94

## 2020-01-28 LAB — BASIC METABOLIC PANEL
BUN: 10 (ref 4–21)
CO2: 31 — AB (ref 13–22)
Chloride: 96 — AB (ref 99–108)
Creatinine: 0.7 (ref 0.5–1.1)
Glucose: 106
Potassium: 4.8 (ref 3.4–5.3)
Sodium: 136 — AB (ref 137–147)

## 2020-01-28 LAB — COMPREHENSIVE METABOLIC PANEL
Albumin: 4.7 (ref 3.5–5.0)
Calcium: 9.9 (ref 8.7–10.7)
GFR calc Af Amer: 99
GFR calc non Af Amer: 82
Globulin: 2.4

## 2020-01-28 LAB — HEPATIC FUNCTION PANEL
ALT: 24 (ref 7–35)
AST: 16 (ref 13–35)
Alkaline Phosphatase: 73 (ref 25–125)
Bilirubin, Total: 0.4

## 2020-02-01 ENCOUNTER — Other Ambulatory Visit: Payer: Self-pay | Admitting: Internal Medicine

## 2020-02-04 ENCOUNTER — Other Ambulatory Visit: Payer: Medicare Other

## 2020-02-10 DIAGNOSIS — M545 Low back pain: Secondary | ICD-10-CM | POA: Diagnosis not present

## 2020-02-10 DIAGNOSIS — M961 Postlaminectomy syndrome, not elsewhere classified: Secondary | ICD-10-CM | POA: Diagnosis not present

## 2020-02-10 DIAGNOSIS — M546 Pain in thoracic spine: Secondary | ICD-10-CM | POA: Diagnosis not present

## 2020-02-10 DIAGNOSIS — M542 Cervicalgia: Secondary | ICD-10-CM | POA: Diagnosis not present

## 2020-02-10 DIAGNOSIS — Z79899 Other long term (current) drug therapy: Secondary | ICD-10-CM | POA: Diagnosis not present

## 2020-02-24 ENCOUNTER — Other Ambulatory Visit: Payer: Self-pay | Admitting: Family Medicine

## 2020-02-26 DIAGNOSIS — I10 Essential (primary) hypertension: Secondary | ICD-10-CM | POA: Diagnosis not present

## 2020-02-27 ENCOUNTER — Encounter: Payer: Self-pay | Admitting: Family Medicine

## 2020-03-05 ENCOUNTER — Other Ambulatory Visit: Payer: Self-pay | Admitting: Family Medicine

## 2020-03-09 DIAGNOSIS — M545 Low back pain, unspecified: Secondary | ICD-10-CM | POA: Diagnosis not present

## 2020-03-09 DIAGNOSIS — M961 Postlaminectomy syndrome, not elsewhere classified: Secondary | ICD-10-CM | POA: Diagnosis not present

## 2020-03-09 DIAGNOSIS — Z79899 Other long term (current) drug therapy: Secondary | ICD-10-CM | POA: Diagnosis not present

## 2020-03-23 ENCOUNTER — Ambulatory Visit (INDEPENDENT_AMBULATORY_CARE_PROVIDER_SITE_OTHER): Payer: Medicare Other

## 2020-03-23 DIAGNOSIS — Z23 Encounter for immunization: Secondary | ICD-10-CM

## 2020-03-26 ENCOUNTER — Other Ambulatory Visit: Payer: Self-pay

## 2020-03-26 DIAGNOSIS — E871 Hypo-osmolality and hyponatremia: Secondary | ICD-10-CM

## 2020-03-26 DIAGNOSIS — G4701 Insomnia due to medical condition: Secondary | ICD-10-CM

## 2020-03-26 DIAGNOSIS — F1129 Opioid dependence with unspecified opioid-induced disorder: Secondary | ICD-10-CM

## 2020-03-26 DIAGNOSIS — G8929 Other chronic pain: Secondary | ICD-10-CM

## 2020-03-26 DIAGNOSIS — I10 Essential (primary) hypertension: Secondary | ICD-10-CM

## 2020-03-26 DIAGNOSIS — G894 Chronic pain syndrome: Secondary | ICD-10-CM

## 2020-03-26 DIAGNOSIS — M5137 Other intervertebral disc degeneration, lumbosacral region: Secondary | ICD-10-CM

## 2020-03-26 DIAGNOSIS — F339 Major depressive disorder, recurrent, unspecified: Secondary | ICD-10-CM

## 2020-03-26 DIAGNOSIS — E782 Mixed hyperlipidemia: Secondary | ICD-10-CM

## 2020-04-02 DIAGNOSIS — F332 Major depressive disorder, recurrent severe without psychotic features: Secondary | ICD-10-CM | POA: Diagnosis not present

## 2020-04-06 DIAGNOSIS — I10 Essential (primary) hypertension: Secondary | ICD-10-CM | POA: Diagnosis not present

## 2020-04-06 DIAGNOSIS — Z79899 Other long term (current) drug therapy: Secondary | ICD-10-CM | POA: Diagnosis not present

## 2020-04-06 DIAGNOSIS — M545 Low back pain, unspecified: Secondary | ICD-10-CM | POA: Diagnosis not present

## 2020-04-06 DIAGNOSIS — M961 Postlaminectomy syndrome, not elsewhere classified: Secondary | ICD-10-CM | POA: Diagnosis not present

## 2020-04-08 ENCOUNTER — Other Ambulatory Visit: Payer: Self-pay | Admitting: Family Medicine

## 2020-04-09 DIAGNOSIS — Z23 Encounter for immunization: Secondary | ICD-10-CM | POA: Diagnosis not present

## 2020-04-13 DIAGNOSIS — M549 Dorsalgia, unspecified: Secondary | ICD-10-CM | POA: Diagnosis not present

## 2020-04-13 DIAGNOSIS — M4325 Fusion of spine, thoracolumbar region: Secondary | ICD-10-CM | POA: Diagnosis not present

## 2020-04-13 DIAGNOSIS — M546 Pain in thoracic spine: Secondary | ICD-10-CM | POA: Diagnosis not present

## 2020-04-24 ENCOUNTER — Other Ambulatory Visit: Payer: Self-pay | Admitting: Family Medicine

## 2020-04-28 DIAGNOSIS — M21161 Varus deformity, not elsewhere classified, right knee: Secondary | ICD-10-CM | POA: Diagnosis not present

## 2020-04-28 DIAGNOSIS — G8929 Other chronic pain: Secondary | ICD-10-CM | POA: Diagnosis not present

## 2020-04-28 DIAGNOSIS — M549 Dorsalgia, unspecified: Secondary | ICD-10-CM | POA: Diagnosis not present

## 2020-04-28 DIAGNOSIS — M5136 Other intervertebral disc degeneration, lumbar region: Secondary | ICD-10-CM | POA: Diagnosis not present

## 2020-04-28 DIAGNOSIS — M419 Scoliosis, unspecified: Secondary | ICD-10-CM | POA: Diagnosis not present

## 2020-04-28 DIAGNOSIS — M4316 Spondylolisthesis, lumbar region: Secondary | ICD-10-CM | POA: Diagnosis not present

## 2020-04-28 DIAGNOSIS — M5134 Other intervertebral disc degeneration, thoracic region: Secondary | ICD-10-CM | POA: Diagnosis not present

## 2020-04-28 DIAGNOSIS — Z981 Arthrodesis status: Secondary | ICD-10-CM | POA: Diagnosis not present

## 2020-04-28 DIAGNOSIS — M4325 Fusion of spine, thoracolumbar region: Secondary | ICD-10-CM | POA: Diagnosis not present

## 2020-04-28 DIAGNOSIS — M1990 Unspecified osteoarthritis, unspecified site: Secondary | ICD-10-CM | POA: Diagnosis not present

## 2020-04-28 DIAGNOSIS — M4804 Spinal stenosis, thoracic region: Secondary | ICD-10-CM | POA: Diagnosis not present

## 2020-04-28 DIAGNOSIS — R29818 Other symptoms and signs involving the nervous system: Secondary | ICD-10-CM | POA: Diagnosis not present

## 2020-04-28 DIAGNOSIS — M40294 Other kyphosis, thoracic region: Secondary | ICD-10-CM | POA: Diagnosis not present

## 2020-04-28 DIAGNOSIS — M5030 Other cervical disc degeneration, unspecified cervical region: Secondary | ICD-10-CM | POA: Diagnosis not present

## 2020-05-08 ENCOUNTER — Other Ambulatory Visit: Payer: Self-pay

## 2020-05-08 MED ORDER — SODIUM CHLORIDE 1 G PO TABS
2.0000 g | ORAL_TABLET | Freq: Every day | ORAL | 0 refills | Status: DC
Start: 2020-05-08 — End: 2020-08-11

## 2020-05-13 ENCOUNTER — Other Ambulatory Visit: Payer: Self-pay

## 2020-05-13 ENCOUNTER — Other Ambulatory Visit: Payer: Medicare Other

## 2020-05-13 ENCOUNTER — Telehealth: Payer: Self-pay

## 2020-05-13 DIAGNOSIS — E871 Hypo-osmolality and hyponatremia: Secondary | ICD-10-CM

## 2020-05-13 NOTE — Telephone Encounter (Signed)
Pt is coming in now for labs. She is going to schedule an OV when she checks out today.

## 2020-05-13 NOTE — Telephone Encounter (Signed)
Labs ordered. Please schedule patient

## 2020-05-13 NOTE — Telephone Encounter (Signed)
Please advise 

## 2020-05-13 NOTE — Telephone Encounter (Signed)
Can schedule for next available lab visit. Order bmp for hyponatremia. Pt also needs to get scheduled for her physical.  If can't get her in for physical, will need f/u appointment in January for recheck. Thanks.

## 2020-05-13 NOTE — Telephone Encounter (Signed)
Pt is requesting labs to have her sodium levels checked. She is wanting them checked today.

## 2020-05-14 DIAGNOSIS — M546 Pain in thoracic spine: Secondary | ICD-10-CM | POA: Diagnosis not present

## 2020-05-14 DIAGNOSIS — M4325 Fusion of spine, thoracolumbar region: Secondary | ICD-10-CM | POA: Diagnosis not present

## 2020-05-14 DIAGNOSIS — M549 Dorsalgia, unspecified: Secondary | ICD-10-CM | POA: Diagnosis not present

## 2020-05-14 LAB — BASIC METABOLIC PANEL
BUN: 15 mg/dL (ref 7–25)
CO2: 27 mmol/L (ref 20–32)
Calcium: 9.4 mg/dL (ref 8.6–10.4)
Chloride: 95 mmol/L — ABNORMAL LOW (ref 98–110)
Creat: 0.79 mg/dL (ref 0.50–0.99)
Glucose, Bld: 99 mg/dL (ref 65–99)
Potassium: 4.6 mmol/L (ref 3.5–5.3)
Sodium: 131 mmol/L — ABNORMAL LOW (ref 135–146)

## 2020-05-28 ENCOUNTER — Telehealth: Payer: Self-pay | Admitting: Internal Medicine

## 2020-05-28 NOTE — Telephone Encounter (Signed)
Patient called to get Forteo PA started.  Wanted only to speak with Cristy Folks.  I provided office information and advised that I would forward a note to our clinical staff regarding this request.

## 2020-06-01 ENCOUNTER — Other Ambulatory Visit: Payer: Self-pay | Admitting: Family Medicine

## 2020-06-03 NOTE — Telephone Encounter (Signed)
Linda,  Please see below 

## 2020-06-05 ENCOUNTER — Other Ambulatory Visit: Payer: Self-pay

## 2020-06-05 MED ORDER — LOSARTAN POTASSIUM 50 MG PO TABS: 50.0000 mg | ORAL_TABLET | Freq: Two times a day (BID) | ORAL | 3 refills | Status: DC

## 2020-06-08 NOTE — Telephone Encounter (Addendum)
Patient reports that she does not need the prior auth.  She is seeing an MD at [facility] for her upcoming surgery, and he has taken care of this and will continue with her Thyroid care. post surgery. His name is Dr. Prudencio Burly.

## 2020-06-17 ENCOUNTER — Ambulatory Visit: Payer: Medicare Other | Admitting: Family Medicine

## 2020-07-08 ENCOUNTER — Telehealth: Payer: Self-pay

## 2020-07-08 NOTE — Telephone Encounter (Signed)
Pt called asking if you could place orders to get her sodium levels checked. Pt states she is beginning to experience symptoms of hyponatremia. Please advise.

## 2020-07-08 NOTE — Telephone Encounter (Signed)
Please schedule office visit. She was last here in July.

## 2020-07-08 NOTE — Telephone Encounter (Signed)
Scheduled same day appt due to pt experiencing fatigue

## 2020-07-09 ENCOUNTER — Ambulatory Visit: Payer: Medicare Other | Admitting: Family Medicine

## 2020-07-09 ENCOUNTER — Other Ambulatory Visit: Payer: Medicare Other

## 2020-07-09 DIAGNOSIS — Z20822 Contact with and (suspected) exposure to covid-19: Secondary | ICD-10-CM

## 2020-07-10 LAB — SARS-COV-2, NAA 2 DAY TAT

## 2020-07-10 LAB — NOVEL CORONAVIRUS, NAA: SARS-CoV-2, NAA: NOT DETECTED

## 2020-07-15 ENCOUNTER — Other Ambulatory Visit: Payer: Self-pay

## 2020-07-15 ENCOUNTER — Ambulatory Visit: Payer: Medicare Other | Admitting: Family Medicine

## 2020-07-16 ENCOUNTER — Other Ambulatory Visit: Payer: Medicare Other

## 2020-07-16 DIAGNOSIS — Z20822 Contact with and (suspected) exposure to covid-19: Secondary | ICD-10-CM

## 2020-07-17 ENCOUNTER — Ambulatory Visit: Payer: Medicare Other | Admitting: Internal Medicine

## 2020-07-17 LAB — SARS-COV-2, NAA 2 DAY TAT

## 2020-07-17 LAB — NOVEL CORONAVIRUS, NAA: SARS-CoV-2, NAA: NOT DETECTED

## 2020-07-22 ENCOUNTER — Encounter: Payer: Self-pay | Admitting: Family Medicine

## 2020-07-22 ENCOUNTER — Other Ambulatory Visit: Payer: Self-pay

## 2020-07-22 ENCOUNTER — Ambulatory Visit (INDEPENDENT_AMBULATORY_CARE_PROVIDER_SITE_OTHER): Payer: Medicare Other | Admitting: Family Medicine

## 2020-07-22 VITALS — BP 134/82 | HR 72 | Temp 97.1°F | Resp 17 | Wt 155.4 lb

## 2020-07-22 DIAGNOSIS — K219 Gastro-esophageal reflux disease without esophagitis: Secondary | ICD-10-CM | POA: Diagnosis not present

## 2020-07-22 DIAGNOSIS — Z79899 Other long term (current) drug therapy: Secondary | ICD-10-CM | POA: Diagnosis not present

## 2020-07-22 DIAGNOSIS — M810 Age-related osteoporosis without current pathological fracture: Secondary | ICD-10-CM

## 2020-07-22 DIAGNOSIS — E782 Mixed hyperlipidemia: Secondary | ICD-10-CM

## 2020-07-22 DIAGNOSIS — E222 Syndrome of inappropriate secretion of antidiuretic hormone: Secondary | ICD-10-CM

## 2020-07-22 DIAGNOSIS — G8929 Other chronic pain: Secondary | ICD-10-CM

## 2020-07-22 DIAGNOSIS — I10 Essential (primary) hypertension: Secondary | ICD-10-CM

## 2020-07-22 DIAGNOSIS — E871 Hypo-osmolality and hyponatremia: Secondary | ICD-10-CM | POA: Diagnosis not present

## 2020-07-22 DIAGNOSIS — Z8249 Family history of ischemic heart disease and other diseases of the circulatory system: Secondary | ICD-10-CM

## 2020-07-22 DIAGNOSIS — F339 Major depressive disorder, recurrent, unspecified: Secondary | ICD-10-CM

## 2020-07-22 DIAGNOSIS — Z7185 Encounter for immunization safety counseling: Secondary | ICD-10-CM

## 2020-07-22 DIAGNOSIS — F1129 Opioid dependence with unspecified opioid-induced disorder: Secondary | ICD-10-CM

## 2020-07-22 DIAGNOSIS — G894 Chronic pain syndrome: Secondary | ICD-10-CM

## 2020-07-22 DIAGNOSIS — G4701 Insomnia due to medical condition: Secondary | ICD-10-CM

## 2020-07-22 LAB — LIPID PANEL
Cholesterol: 195 mg/dL (ref 0–200)
HDL: 140.5 mg/dL (ref >39.00)
LDL Cholesterol: 33 mg/dL (ref 0–99)
NonHDL: 54.08
Total CHOL/HDL Ratio: 1
Triglycerides: 105 mg/dL (ref 0.0–149.0)
VLDL: 21 mg/dL (ref 0.0–40.0)

## 2020-07-22 LAB — CBC WITH DIFFERENTIAL/PLATELET
Basophils Absolute: 0.1 10*3/uL (ref 0.0–0.1)
Basophils Relative: 1.2 % (ref 0.0–3.0)
Eosinophils Absolute: 0.3 10*3/uL (ref 0.0–0.7)
Eosinophils Relative: 5.8 % — ABNORMAL HIGH (ref 0.0–5.0)
HCT: 35.8 % — ABNORMAL LOW (ref 36.0–46.0)
Hemoglobin: 12.6 g/dL (ref 12.0–15.0)
Lymphocytes Relative: 45 % (ref 12.0–46.0)
Lymphs Abs: 2.1 10*3/uL (ref 0.7–4.0)
MCHC: 35.2 g/dL (ref 30.0–36.0)
MCV: 93.2 fl (ref 78.0–100.0)
Monocytes Absolute: 0.5 10*3/uL (ref 0.1–1.0)
Monocytes Relative: 10.1 % (ref 3.0–12.0)
Neutro Abs: 1.8 10*3/uL (ref 1.4–7.7)
Neutrophils Relative %: 37.9 % — ABNORMAL LOW (ref 43.0–77.0)
Platelets: 340 10*3/uL (ref 150.0–400.0)
RBC: 3.84 Mil/uL — ABNORMAL LOW (ref 3.87–5.11)
RDW: 13.1 % (ref 11.5–15.5)
WBC: 4.6 10*3/uL (ref 4.0–10.5)

## 2020-07-22 LAB — COMPREHENSIVE METABOLIC PANEL
ALT: 16 U/L (ref 0–35)
AST: 24 U/L (ref 0–37)
Albumin: 4.5 g/dL (ref 3.5–5.2)
Alkaline Phosphatase: 94 U/L (ref 39–117)
BUN: 10 mg/dL (ref 6–23)
CO2: 29 mEq/L (ref 19–32)
Calcium: 9.5 mg/dL (ref 8.4–10.5)
Chloride: 93 mEq/L — ABNORMAL LOW (ref 96–112)
Creatinine, Ser: 0.79 mg/dL (ref 0.40–1.20)
GFR: 79.91 mL/min (ref >60.00)
Glucose, Bld: 92 mg/dL (ref 70–99)
Potassium: 5.1 mEq/L (ref 3.5–5.1)
Sodium: 130 mEq/L — ABNORMAL LOW (ref 135–145)
Total Bilirubin: 0.4 mg/dL (ref 0.2–1.2)
Total Protein: 7.1 g/dL (ref 6.0–8.3)

## 2020-07-22 LAB — VITAMIN B12: Vitamin B-12: >1506 pg/mL — ABNORMAL HIGH (ref 211–911)

## 2020-07-22 LAB — VITAMIN D 25 HYDROXY (VIT D DEFICIENCY, FRACTURES): VITD: 51.86 ng/mL (ref 30.00–100.00)

## 2020-07-22 LAB — TSH: TSH: 2.15 u[IU]/mL (ref 0.35–4.50)

## 2020-07-22 MED ORDER — LOSARTAN POTASSIUM 100 MG PO TABS: 100.0000 mg | ORAL_TABLET | Freq: Every day | ORAL | 3 refills | Status: AC

## 2020-07-22 MED ORDER — SHINGRIX 50 MCG/0.5ML IM SUSR: 0.5000 mL | Freq: Once | INTRAMUSCULAR | 0 refills | Status: AC

## 2020-07-22 NOTE — Progress Notes (Signed)
Subjective  CC:  Chief Complaint  Patient presents with  . Fatigue    Low sodium   . Follow-up    HPI: Shannon Young is a 65 y.o. female who presents to the office today to address the problems listed above in the chief complaint.  She is overdue for follow-up visit/CPE.  65 year old with chronic hyponatremia due to SIADH and medication side effects presents because she feels her sodium may be too low again.  She reports she is having trouble with focus and clear mentation.  She is also feeling fatigued.  This is how she felt when her sodium had dropped in the past.  It was last checked in July.  She continues on 2 salt tablets daily and mild fluid restriction.  However over the last several weeks she has decreased her fluid intake more because of her symptoms.  Hypertension f/u: Control is good . Pt reports she is doing well. taking medications as instructed, no medication side effects noted, no TIAs, no chest pain on exertion, no dyspnea on exertion, no swelling of ankles. She denies adverse effects from his BP medications. Compliance with medication is good.  She takes losartan 100 mg daily.  Osteoporosis: Reviewed [facility] endocrinology notes.  Started on Forteo.  GERD is controlled on chronic PPI.  Overdue to check B12 levels.  Insomnia secondary to chronic pain: Has been managed relatively well on trazodone nightly.  She denies adverse effects  Chronic pain due to multiple back problems and chronic opioids: Currently being managed by [facility] pain management, neurosurgery and [facility].  She is scheduled to have repair from her most recent failed laminectomy in the next month.  She continues to suffer daily with pain.  This limits her activities.  Major depression: Despite her stressors, her mood is improved on Trintellix.  Mixed hyperlipidemia with a strong family history of heart disease: She is on a statin.  She has not had her lipids checked in over a year.  She  tolerates it well.  She is compliant with the medication.  Health maintenance: She is overdue for Pap smear but defers due to her current pain levels.  We will be difficult for her to have this type of exam.  She is eligible for Shingrix vaccination  Assessment  1. SIADH (syndrome of inappropriate ADH production) ([place])   2. Hyponatremia   3. Essential hypertension   4. Age-related osteoporosis without current pathological fracture   5. Long-term current use of proton pump inhibitor therapy   6. Major depression, recurrent, chronic ([place])   7. Gastroesophageal reflux disease, unspecified whether esophagitis present   8. Insomnia secondary to chronic pain   9. Chronic pain syndrome   10. Opioid dependence with opioid-induced disorder ([place])   11. Vaccine counseling   12. Mixed hyperlipidemia   13. Family history of ischemic heart disease (IHD)      Plan   Chronic hyponatremia due to SIADH on salt tablets and fluid restriction: We will recheck today.  Recommend following every 3 months.  She no longer has nephrologist.  She is seeing a nephrologist in [place].  Her mentation is clear right now  Hypertension f/u: BP control is well controlled.  Continue losartan 100 mg daily.  Recheck renal function electrolytes  Hyperlipidemia f/u: Recheck nonfasting lipids and liver panel on statin nightly.  Narcotic dependence for chronic pain managed by [facility] and [facility].  Will likely need medical clearance for upcoming surgery  GERD is well  controlled on chronic PPI.  Check B12 levels.  Major depression: Fortunately has stabilized in spite of chronic pain and stressors.  She does report that she is going to be moving back to [place] in the near future.  Insomnia: Continue trazodone 50 mg nightly.  Manages her sleep  Health maintenance: Shingrix prescription given.  Recommend vaccination.  Defer Pap smear for a year.  Recommend annual physical. Total length of this  visit was 40 minutes including chart review, lab review, medication review and face-to-face interaction with patient examination and management of her chronic medical problems as noted above.  Education regarding management of these chronic disease states was given. Management strategies discussed on successive visits include dietary and exercise recommendations, goals of achieving and maintaining IBW, and lifestyle modifications aiming for adequate sleep and minimizing stressors.   Follow up: 3 months for recheck  Orders Placed This Encounter  Procedures  . TSH  . Lipid panel  . Comprehensive metabolic panel  . VITAMIN D 25 Hydroxy (Vit-D Deficiency, Fractures)  . Vitamin B12  . CBC with Differential/Platelet   Meds ordered this encounter  Medications  . losartan (COZAAR) 100 MG tablet    Sig: Take 1 tablet (100 mg total) by mouth daily.    Dispense:  180 tablet    Refill:  3    Please hold for next due refill; switching to 100mg  tab. Thanks.  . Zoster Vaccine Adjuvanted [facility]) injection    Sig: Inject 0.5 mLs into the muscle once for 1 dose. Please give 2nd dose 2-6 months after first dose    Dispense:  2 each    Refill:  0      BP Readings from Last 3 Encounters:  07/22/20 134/82  01/13/20 112/72  12/25/19 (!) 143/75   Wt Readings from Last 3 Encounters:  07/22/20 155 lb 6.4 oz (70.5 kg)  01/13/20 152 lb 6.4 oz (69.1 kg)  11/29/19 156 lb (70.8 kg)    Lab Results  Component Value Date   CHOL 102 01/11/2019   CHOL 119 10/17/2017   Lab Results  Component Value Date   HDL 56.30 01/11/2019   HDL 56.60 10/17/2017   Lab Results  Component Value Date   LDLCALC 26 01/11/2019   LDLCALC 49 10/17/2017   Lab Results  Component Value Date   TRIG 95.0 01/11/2019   TRIG 68.0 10/17/2017   Lab Results  Component Value Date   CHOLHDL 2 01/11/2019   CHOLHDL 2 10/17/2017   No results found for: LDLDIRECT Lab Results  Component Value Date   CREATININE 0.79  05/13/2020   BUN 15 05/13/2020   NA 131 (L) 05/13/2020   K 4.6 05/13/2020   CL 95 (L) 05/13/2020   CO2 27 05/13/2020    The ASCVD Risk score (Goff DC Jr., et al., 2013) failed to calculate for the following reasons:   The valid total cholesterol range is 130 to 320 mg/dL  I reviewed the patients updated PMH, FH, and SocHx.    Patient Active Problem List   Diagnosis Date Noted  . Age-related osteoporosis without current pathological fracture 01/14/2020    Priority: High  . Tubular adenoma of colon 08/24/2018    Priority: High  . Major depression, recurrent, chronic ([place]) 10/17/2017    Priority: High  . Alcohol dependence in remission ([place]) 10/17/2017    Priority: High  . Insomnia secondary to chronic pain 10/17/2017    Priority: High  . Spinal stenosis of lumbar region 10/17/2017  Priority: High  . Alcoholic hepatitis 74/04/8785    Priority: High  . Essential hypertension 06/29/2015    Priority: High  . Chronic pain syndrome 06/01/2010    Priority: High  . Opioid dependence ([place]) 06/01/2010    Priority: High  . Postlaminectomy syndrome, not elsewhere classified 06/01/2010    Priority: High  . DDD (degenerative disc disease), lumbosacral 03/19/2010    Priority: High  . Long-term current use of proton pump inhibitor therapy 07/22/2020    Priority: Medium  . SIADH (syndrome of inappropriate ADH production) ([place]) 01/17/2019    Priority: Medium  . Family history of ischemic heart disease (IHD) 06/29/2015    Priority: Medium  . GERD (gastroesophageal reflux disease) 05/10/2013    Priority: Medium  . Urinary incontinence 02/27/2018    Priority: Low  . Nonrheumatic mitral valve regurgitation 06/29/2015    Priority: Low  . Mixed hyperlipidemia 07/22/2020  . Hyponatremia 01/14/2020  . Incontinence of feces   . Paresthesia 02/27/2018  . Memory loss 11/23/2017    Allergies: Gabapentin and Bupropion  Social History: Patient  reports that she has been smoking  e-cigarettes. She has never used smokeless tobacco. She reports previous alcohol use. She reports that she does not use drugs.  Current Meds  Medication Sig  . atorvastatin (LIPITOR) 20 MG tablet TAKE 1 TABLET BY MOUTH AT BEDTIME  . cholecalciferol (VITAMIN D3) 25 MCG (1000 UNIT) tablet Take 1,000 Units by mouth daily.  . fentaNYL (DURAGESIC) 75 MCG/HR Place 1 patch onto the skin every 3 (three) days.  . fluticasone (FLONASE) 50 MCG/ACT nasal spray Place 1 spray into both nostrils as needed.   . folic acid (FOLVITE) 1 MG tablet Take by mouth.  . Glucosamine 500 MG CAPS Take by mouth 2 (two) times daily.  . Multiple Vitamin (MULTIVITAMIN) capsule Take by mouth daily.   . naloxone (NARCAN) nasal spray 4 mg/0.1 mL Spray into one nostril for suspected overdose. Repeat x1, if no response after 3 minutes.  Dispense one package with 2 nasal sprays.  . ondansetron (ZOFRAN-ODT) 4 MG disintegrating tablet Take 1 tablet (4 mg total) by mouth every 8 (eight) hours as needed for nausea or vomiting.  Marland Kitchen oxycodone (ROXICODONE) 30 MG immediate release tablet Take 60 mg by mouth 4 (four) times daily.   . Oxycodone HCl 20 MG TABS Take 10 mg by mouth every 4 (four) hours as needed.   . pantoprazole (PROTONIX) 40 MG tablet Take 1 tablet by mouth twice daily  . sodium chloride 1 g tablet Take 2 tablets (2 g total) by mouth daily.  . solifenacin (VESICARE) 5 MG tablet Take 5 mg by mouth daily.  . Teriparatide, Recombinant, (FORTEO) 620 MCG/2.48ML SOPN Inject 20 mcg into the skin daily.  Marland Kitchen thiamine 100 MG tablet Take by mouth.  . traZODone (DESYREL) 50 MG tablet Take 1 tablet (50 mg total) by mouth at bedtime.  . vitamin B-12 (CYANOCOBALAMIN) 500 MCG tablet Take 500 mcg by mouth daily.  Marland Kitchen vortioxetine HBr (TRINTELLIX) 5 MG TABS tablet Take 5 mg by mouth daily.  Marland Kitchen Zoster Vaccine Adjuvanted [facility]) injection Inject 0.5 mLs into the muscle once for 1 dose. Please give 2nd dose 2-6 months after first dose  .  [DISCONTINUED] losartan (COZAAR) 50 MG tablet Take 1 tablet (50 mg total) by mouth 2 (two) times daily.    Review of Systems: Cardiovascular: negative for chest pain, palpitations, leg swelling, orthopnea Respiratory: negative for SOB, wheezing or persistent cough Gastrointestinal: negative for abdominal  pain Genitourinary: negative for dysuria or gross hematuria  Objective  Vitals: BP 134/82   Pulse 72   Temp (!) 97.1 F (36.2 C) (Temporal)   Resp 17   Wt 155 lb 6.4 oz (70.5 kg)   SpO2 92%   BMI 25.86 kg/m  General: Standing and shifting due to pain Psych:  Alert and oriented, normal mood and affect HEENT:  Normocephalic, atraumatic, supple neck  Cardiovascular:  RRR without murmur. no edema Respiratory:  Good breath sounds bilaterally, CTAB with normal respiratory effort Back: Large deformity visible in lower thoracic spine.  Commons side effects, risks, benefits, and alternatives for medications and treatment plan prescribed today were discussed, and the patient expressed understanding of the given instructions. Patient is instructed to call or message via MyChart if he/she has any questions or concerns regarding our treatment plan. No barriers to understanding were identified. We discussed Red Flag symptoms and signs in detail. Patient expressed understanding regarding what to do in case of urgent or emergency type symptoms.   Medication list was reconciled, printed and provided to the patient in AVS. Patient instructions and summary information was reviewed with the patient as documented in the AVS. This note was prepared with assistance of Dragon voice recognition software. Occasional wrong-word or sound-a-like substitutions may have occurred due to the inherent limitations of voice recognition software  This visit occurred during the SARS-CoV-2 public health emergency.  Safety protocols were in place, including screening questions prior to the visit, additional usage of staff  PPE, and extensive cleaning of exam room while observing appropriate contact time as indicated for disinfecting solutions.

## 2020-07-22 NOTE — Patient Instructions (Signed)
Please return in 3 months for recheck.   I will release your lab results to you on your MyChart account with further instructions. Please reply with any questions.   Please take the prescription for Shingrix to the pharmacy so they may administer the vaccinations. Your insurance will then cover the injections.    If you have any questions or concerns, please don't hesitate to send me a message via MyChart or call the office at [phone]. Thank you for visiting with [place] today! It's our pleasure caring for you.

## 2020-08-07 ENCOUNTER — Telehealth: Payer: Self-pay | Admitting: Family Medicine

## 2020-08-07 NOTE — Telephone Encounter (Signed)
PT WCB. Due to schedule Medicare Annual Wellness Visit (AWV) either virtually OR in office.   Last AWV 05/16/19; please schedule at anytime with [facility]-Nurse Health Advisor at [facility].  This should be a 45 minute visit.

## 2020-08-11 ENCOUNTER — Other Ambulatory Visit: Payer: Self-pay | Admitting: Family Medicine

## 2020-09-24 ENCOUNTER — Other Ambulatory Visit: Payer: Self-pay

## 2020-09-24 MED ORDER — PANTOPRAZOLE SODIUM 40 MG PO TBEC: 1.0000 | DELAYED_RELEASE_TABLET | Freq: Two times a day (BID) | ORAL | 3 refills | Status: AC

## 2020-10-21 ENCOUNTER — Ambulatory Visit: Payer: Medicare Other | Admitting: Family Medicine

## 2020-10-27 ENCOUNTER — Encounter: Payer: Self-pay | Admitting: Family Medicine

## 2020-11-19 ENCOUNTER — Telehealth: Payer: Self-pay

## 2020-11-19 NOTE — Telephone Encounter (Signed)
Patient has moved to [place] and is needing a refill on her sodium tablets sent to the [facility] there. OK to refill?

## 2020-11-20 ENCOUNTER — Other Ambulatory Visit: Payer: Self-pay

## 2020-11-20 MED ORDER — SODIUM CHLORIDE 1 G PO TABS: 2.0000 g | ORAL_TABLET | Freq: Every day | ORAL | 0 refills | Status: AC

## 2020-11-20 NOTE — Telephone Encounter (Signed)
Refill sent.

## 2021-12-08 ENCOUNTER — Telehealth: Payer: Self-pay | Admitting: Family Medicine

## 2021-12-08 NOTE — Telephone Encounter (Signed)
Copied from [place] [id number]. Topic: Medicare AWV >> Dec 08, 2021 10:14 AM Devoria Glassing wrote: Reason for CRM: Left message for patient to schedule Annual Wellness Visit.  Please schedule with Nurse Health Advisor Charlott Rakes, RN at [facility]. This appt can be telephone or office visit. Please call [phone] ask for [facility]

## 2022-02-21 ENCOUNTER — Encounter: Payer: Self-pay | Admitting: *Deleted

## 2022-05-12 ENCOUNTER — Encounter: Payer: Self-pay | Admitting: *Deleted

## 2024-05-08 ENCOUNTER — Telehealth: Payer: Self-pay

## 2024-05-08 NOTE — Telephone Encounter (Signed)
 Attempted to reach patient concerning colonoscopy recall; unable to speak with patient;  left message and number to the office for patient to call back and schedule appts;
